# Patient Record
Sex: Male | Born: 1935 | Race: White | Hispanic: No | Marital: Married | Smoking: Never smoker
Health system: Southern US, Community
[De-identification: ages and names within clinical notes are randomized; demographics above are authoritative.]

## PROBLEM LIST (undated history)

## (undated) DIAGNOSIS — N400 Enlarged prostate without lower urinary tract symptoms: Secondary | ICD-10-CM

## (undated) DIAGNOSIS — I10 Essential (primary) hypertension: Secondary | ICD-10-CM

## (undated) DIAGNOSIS — K922 Gastrointestinal hemorrhage, unspecified: Secondary | ICD-10-CM

## (undated) HISTORY — PX: APPENDECTOMY: SHX54

## (undated) HISTORY — PX: CYST EXCISION: SHX5701

## (undated) HISTORY — PX: CHOLECYSTECTOMY: SHX55

---

## 2001-07-10 ENCOUNTER — Encounter: Payer: Self-pay | Admitting: Family Medicine

## 2001-07-10 ENCOUNTER — Ambulatory Visit (HOSPITAL_COMMUNITY): Admission: RE | Admit: 2001-07-10 | Discharge: 2001-07-10 | Payer: Self-pay | Admitting: Family Medicine

## 2002-02-09 ENCOUNTER — Other Ambulatory Visit: Admission: RE | Admit: 2002-02-09 | Discharge: 2002-02-09 | Payer: Self-pay | Admitting: Dermatology

## 2002-08-05 ENCOUNTER — Encounter: Payer: Self-pay | Admitting: Family Medicine

## 2002-08-05 ENCOUNTER — Ambulatory Visit (HOSPITAL_COMMUNITY): Admission: RE | Admit: 2002-08-05 | Discharge: 2002-08-05 | Payer: Self-pay | Admitting: Family Medicine

## 2002-11-07 ENCOUNTER — Encounter: Payer: Self-pay | Admitting: *Deleted

## 2002-11-07 ENCOUNTER — Emergency Department (HOSPITAL_COMMUNITY): Admission: EM | Admit: 2002-11-07 | Discharge: 2002-11-07 | Payer: Self-pay | Admitting: *Deleted

## 2003-03-03 ENCOUNTER — Ambulatory Visit (HOSPITAL_COMMUNITY): Admission: RE | Admit: 2003-03-03 | Discharge: 2003-03-03 | Payer: Self-pay | Admitting: Internal Medicine

## 2004-02-18 ENCOUNTER — Ambulatory Visit (HOSPITAL_COMMUNITY): Admission: RE | Admit: 2004-02-18 | Discharge: 2004-02-18 | Payer: Self-pay | Admitting: Family Medicine

## 2004-07-06 ENCOUNTER — Ambulatory Visit (HOSPITAL_COMMUNITY): Admission: RE | Admit: 2004-07-06 | Discharge: 2004-07-06 | Payer: Self-pay | Admitting: Family Medicine

## 2004-12-18 ENCOUNTER — Ambulatory Visit: Payer: Self-pay | Admitting: Internal Medicine

## 2005-04-10 ENCOUNTER — Ambulatory Visit (HOSPITAL_COMMUNITY): Admission: RE | Admit: 2005-04-10 | Discharge: 2005-04-10 | Payer: Self-pay | Admitting: Family Medicine

## 2005-08-03 ENCOUNTER — Other Ambulatory Visit: Admission: RE | Admit: 2005-08-03 | Discharge: 2005-08-03 | Payer: Self-pay | Admitting: Urology

## 2006-03-19 ENCOUNTER — Ambulatory Visit (HOSPITAL_COMMUNITY): Admission: RE | Admit: 2006-03-19 | Discharge: 2006-03-19 | Payer: Self-pay | Admitting: Family Medicine

## 2006-04-11 ENCOUNTER — Ambulatory Visit (HOSPITAL_COMMUNITY): Admission: RE | Admit: 2006-04-11 | Discharge: 2006-04-11 | Payer: Self-pay | Admitting: Family Medicine

## 2006-06-18 ENCOUNTER — Ambulatory Visit (HOSPITAL_COMMUNITY): Admission: RE | Admit: 2006-06-18 | Discharge: 2006-06-18 | Payer: Self-pay | Admitting: Family Medicine

## 2007-01-16 ENCOUNTER — Ambulatory Visit (HOSPITAL_COMMUNITY): Admission: RE | Admit: 2007-01-16 | Discharge: 2007-01-16 | Payer: Self-pay | Admitting: Internal Medicine

## 2007-05-05 ENCOUNTER — Ambulatory Visit (HOSPITAL_COMMUNITY): Admission: RE | Admit: 2007-05-05 | Discharge: 2007-05-05 | Payer: Self-pay | Admitting: Family Medicine

## 2007-12-16 ENCOUNTER — Ambulatory Visit (HOSPITAL_COMMUNITY): Admission: RE | Admit: 2007-12-16 | Discharge: 2007-12-16 | Payer: Self-pay | Admitting: *Deleted

## 2009-03-15 ENCOUNTER — Ambulatory Visit (HOSPITAL_COMMUNITY): Admission: RE | Admit: 2009-03-15 | Discharge: 2009-03-15 | Payer: Self-pay | Admitting: Family Medicine

## 2011-03-02 NOTE — Op Note (Signed)
NAME:  NICK, STULTS NO.:  [id number]   MEDICAL RECORD NO.:  [id number]                   PATIENT TYPE:   LOCATION:                                       FACILITY:   PHYSICIAN:  Lionel December, M.D.                 DATE OF BIRTH:   DATE OF PROCEDURE:  03/03/2003  DATE OF DISCHARGE:                                 OPERATIVE REPORT   PROCEDURE:  Total colonoscopy.   ENDOSCOPIST:  Lionel December, M.D.   INDICATIONS:  This patient is a 77 year old Caucasian male who is undergoing  surveillance colonoscopy.  His last exam was in January 1999.  He has a  small adenoma removed from this cecum.  He is presently asymptomatic.  The  procedure and risks were reviewed with the patient and informed consent was  obtained.   PREOPERATIVE MEDICATIONS:  Demerol 25 mg IV and Versed 3 mg IV.   INSTRUMENT:  Olympus video system.   FINDINGS:  Procedure performed in endoscopy suite.  The patient's vital  signs and O2 saturation were monitored during the procedure and remained  stable.  The patient was placed in the left lateral recumbent position and  rectal examination was performed.  This was within normal limits.   The scope was placed in the rectum and advanced under vision into the  sigmoid colon and beyond.  Preparation was satisfactory.  He has scattered  diverticula throughout the colon, but most of these were in the sigmoid  colon.  The scope was passed to the cecum which was identified by  appendiceal orifice and ileocecal valve.  Pictures were taken for the  record.  As the scope was withdrawn the colonic mucosa was, once again,  carefully examined.  There were 4 small polyps; one at proximal transverse  colon, 2 at sigmoid colon and the largest was in the rectum about 6 mm or  so.  All of these polyps were ablated by cold biopsy.  Polyps from the  sigmoid colon were submitted in 1 container.  The rectal mucosa was  otherwise normal.   The scope was  retroflexed to examine the anorectal junction which was  unremarkable.  The endoscope was straightened and withdrawn.  The patient  tolerated the procedure well.   FINAL DIAGNOSES:  1. Four small polyps that were ablated by cold biopsy.  One was at     transverse colon, two at sigmoid and one in the rectum.  2. Pancolonic diverticulosis.   RECOMMENDATIONS:  1. He will resume his usual meds.  2.     He will continue high fiber diet with Citrucel.  3. I will contact patient with the biopsy results.  If one or more of these     polyps are adenomas he will return for another exam in 5 years from now.  Lionel December, M.D.    NR/MEDQ  D:  03/03/2003  T:  03/03/2003  Job:  [id number]   cc:   Kirk Ruths, M.D.  [address]  [place]  [place] [address]  Fax: [phone]

## 2011-10-25 DIAGNOSIS — L219 Seborrheic dermatitis, unspecified: Secondary | ICD-10-CM | POA: Diagnosis not present

## 2011-10-25 DIAGNOSIS — C44211 Basal cell carcinoma of skin of unspecified ear and external auricular canal: Secondary | ICD-10-CM | POA: Diagnosis not present

## 2011-10-25 DIAGNOSIS — D235 Other benign neoplasm of skin of trunk: Secondary | ICD-10-CM | POA: Diagnosis not present

## 2011-10-25 DIAGNOSIS — L259 Unspecified contact dermatitis, unspecified cause: Secondary | ICD-10-CM | POA: Diagnosis not present

## 2011-12-13 DIAGNOSIS — Z85828 Personal history of other malignant neoplasm of skin: Secondary | ICD-10-CM | POA: Diagnosis not present

## 2011-12-13 DIAGNOSIS — L570 Actinic keratosis: Secondary | ICD-10-CM | POA: Diagnosis not present

## 2012-01-08 DIAGNOSIS — S335XXA Sprain of ligaments of lumbar spine, initial encounter: Secondary | ICD-10-CM | POA: Diagnosis not present

## 2012-01-08 DIAGNOSIS — R7301 Impaired fasting glucose: Secondary | ICD-10-CM | POA: Diagnosis not present

## 2012-01-08 DIAGNOSIS — M545 Low back pain: Secondary | ICD-10-CM | POA: Diagnosis not present

## 2012-01-08 DIAGNOSIS — Z6830 Body mass index (BMI) 30.0-30.9, adult: Secondary | ICD-10-CM | POA: Diagnosis not present

## 2012-02-11 DIAGNOSIS — E785 Hyperlipidemia, unspecified: Secondary | ICD-10-CM | POA: Diagnosis not present

## 2012-02-11 DIAGNOSIS — Z0000 Encounter for general adult medical examination without abnormal findings: Secondary | ICD-10-CM | POA: Diagnosis not present

## 2012-02-11 DIAGNOSIS — I10 Essential (primary) hypertension: Secondary | ICD-10-CM | POA: Diagnosis not present

## 2012-02-11 DIAGNOSIS — Z6830 Body mass index (BMI) 30.0-30.9, adult: Secondary | ICD-10-CM | POA: Diagnosis not present

## 2012-02-12 DIAGNOSIS — E785 Hyperlipidemia, unspecified: Secondary | ICD-10-CM | POA: Diagnosis not present

## 2012-02-12 DIAGNOSIS — I10 Essential (primary) hypertension: Secondary | ICD-10-CM | POA: Diagnosis not present

## 2012-03-18 DIAGNOSIS — R972 Elevated prostate specific antigen [PSA]: Secondary | ICD-10-CM | POA: Diagnosis not present

## 2012-03-24 DIAGNOSIS — R972 Elevated prostate specific antigen [PSA]: Secondary | ICD-10-CM | POA: Diagnosis not present

## 2012-04-14 DIAGNOSIS — H43399 Other vitreous opacities, unspecified eye: Secondary | ICD-10-CM | POA: Diagnosis not present

## 2012-06-24 DIAGNOSIS — Z23 Encounter for immunization: Secondary | ICD-10-CM | POA: Diagnosis not present

## 2012-09-18 DIAGNOSIS — R972 Elevated prostate specific antigen [PSA]: Secondary | ICD-10-CM | POA: Diagnosis not present

## 2012-09-24 DIAGNOSIS — R972 Elevated prostate specific antigen [PSA]: Secondary | ICD-10-CM | POA: Diagnosis not present

## 2012-10-16 DIAGNOSIS — H43399 Other vitreous opacities, unspecified eye: Secondary | ICD-10-CM | POA: Diagnosis not present

## 2013-02-09 ENCOUNTER — Encounter (INDEPENDENT_AMBULATORY_CARE_PROVIDER_SITE_OTHER): Payer: Self-pay | Admitting: *Deleted

## 2013-02-12 ENCOUNTER — Other Ambulatory Visit (INDEPENDENT_AMBULATORY_CARE_PROVIDER_SITE_OTHER): Payer: Self-pay | Admitting: *Deleted

## 2013-02-12 DIAGNOSIS — Z86010 Personal history of colonic polyps: Secondary | ICD-10-CM

## 2013-02-13 ENCOUNTER — Telehealth (INDEPENDENT_AMBULATORY_CARE_PROVIDER_SITE_OTHER): Payer: Self-pay | Admitting: *Deleted

## 2013-02-13 DIAGNOSIS — Z1211 Encounter for screening for malignant neoplasm of colon: Secondary | ICD-10-CM

## 2013-02-13 MED ORDER — PEG-KCL-NACL-NASULF-NA ASC-C 100 G PO SOLR: 1.0000 | Freq: Once | ORAL | Status: DC

## 2013-02-13 NOTE — Telephone Encounter (Signed)
Patient needs movi prep 

## 2013-02-25 ENCOUNTER — Telehealth (INDEPENDENT_AMBULATORY_CARE_PROVIDER_SITE_OTHER): Payer: Self-pay | Admitting: *Deleted

## 2013-02-25 NOTE — Telephone Encounter (Signed)
  Procedure: tcs  Reason/Indication:  Hx polyps  Has patient had this procedure before?  02/2008 (paper chart)  If so, when, by whom and where?    Is there a family history of colon cancer?  no  Who?  What age when diagnosed?    Is patient diabetic?   no      Does patient have prosthetic heart valve?  no  Do you have a pacemaker?  no  Has patient ever had endocarditis? no  Has patient had joint replacement within last 12 months?  no  Is patient on Coumadin, Plavix and/or Aspirin? yes  Medications: asa 81 mg daily, nifedipine 30 mg daily, omeprazole 40 mg bid, vitamins  Allergies: nkda  Medication Adjustment: asa 2 days  Procedure date & time: 03/26/13 at 830

## 2013-02-26 NOTE — Telephone Encounter (Signed)
agree

## 2013-03-16 ENCOUNTER — Encounter (HOSPITAL_COMMUNITY): Payer: Self-pay | Admitting: Pharmacy Technician

## 2013-03-20 DIAGNOSIS — R7301 Impaired fasting glucose: Secondary | ICD-10-CM | POA: Diagnosis not present

## 2013-03-20 DIAGNOSIS — I10 Essential (primary) hypertension: Secondary | ICD-10-CM | POA: Diagnosis not present

## 2013-03-20 DIAGNOSIS — E785 Hyperlipidemia, unspecified: Secondary | ICD-10-CM | POA: Diagnosis not present

## 2013-03-20 DIAGNOSIS — Z0000 Encounter for general adult medical examination without abnormal findings: Secondary | ICD-10-CM | POA: Diagnosis not present

## 2013-03-20 DIAGNOSIS — Z6829 Body mass index (BMI) 29.0-29.9, adult: Secondary | ICD-10-CM | POA: Diagnosis not present

## 2013-03-20 DIAGNOSIS — Z125 Encounter for screening for malignant neoplasm of prostate: Secondary | ICD-10-CM | POA: Diagnosis not present

## 2013-03-25 DIAGNOSIS — R972 Elevated prostate specific antigen [PSA]: Secondary | ICD-10-CM | POA: Diagnosis not present

## 2013-03-26 ENCOUNTER — Encounter (HOSPITAL_COMMUNITY): Payer: Self-pay | Admitting: *Deleted

## 2013-03-26 ENCOUNTER — Encounter (HOSPITAL_COMMUNITY)
Admission: RE | Disposition: A | Discharge status: Home / Self Care | Payer: Self-pay | Source: Ambulatory Visit | Attending: Internal Medicine

## 2013-03-26 ENCOUNTER — Ambulatory Visit (HOSPITAL_COMMUNITY)
Admission: RE | Admit: 2013-03-26 | Discharge: 2013-03-26 | Disposition: A | Discharge status: Home / Self Care | Payer: Medicare Other | Source: Ambulatory Visit | Attending: Internal Medicine | Admitting: Internal Medicine

## 2013-03-26 DIAGNOSIS — Z860100 Personal history of colon polyps, unspecified: Secondary | ICD-10-CM | POA: Insufficient documentation

## 2013-03-26 DIAGNOSIS — Z09 Encounter for follow-up examination after completed treatment for conditions other than malignant neoplasm: Secondary | ICD-10-CM | POA: Insufficient documentation

## 2013-03-26 DIAGNOSIS — K644 Residual hemorrhoidal skin tags: Secondary | ICD-10-CM

## 2013-03-26 DIAGNOSIS — K5730 Diverticulosis of large intestine without perforation or abscess without bleeding: Secondary | ICD-10-CM | POA: Insufficient documentation

## 2013-03-26 HISTORY — PX: COLONOSCOPY: SHX5424

## 2013-03-26 SURGERY — COLONOSCOPY
Anesthesia: Moderate Sedation

## 2013-03-26 MED ORDER — STERILE WATER FOR IRRIGATION IR SOLN
Status: DC | PRN
  Administered 2013-03-26: 09:00:00

## 2013-03-26 MED ORDER — MEPERIDINE HCL 50 MG/ML IJ SOLN
INTRAMUSCULAR | Status: DC | PRN
  Administered 2013-03-26: 25 mg via INTRAVENOUS

## 2013-03-26 MED ORDER — SODIUM CHLORIDE 0.9 % IV SOLN
INTRAVENOUS | Status: DC
  Administered 2013-03-26: 08:00:00 via INTRAVENOUS

## 2013-03-26 MED ORDER — MIDAZOLAM HCL 5 MG/5ML IJ SOLN
INTRAMUSCULAR | Status: DC | PRN
  Administered 2013-03-26: 1 mg via INTRAVENOUS
  Administered 2013-03-26 (×2): 2 mg via INTRAVENOUS

## 2013-03-26 MED ORDER — MEPERIDINE HCL 50 MG/ML IJ SOLN
INTRAMUSCULAR | Status: AC
  Filled 2013-03-26: qty 1

## 2013-03-26 MED ORDER — MIDAZOLAM HCL 5 MG/5ML IJ SOLN
INTRAMUSCULAR | Status: AC
  Filled 2013-03-26: qty 10

## 2013-03-26 NOTE — Op Note (Signed)
COLONOSCOPY PROCEDURE REPORT  PATIENT:  Wesley Reynolds  MR#:  [id number] Birthdate:  04/22/1931, 79 y.o., male Endoscopist:  Dr. Malissa Hippo, MD Referred By:  Dr. Colette Ribas, MD  Procedure Date: 03/26/2013  Procedure:   Colonoscopy  Indications: Patient is 79 year old Caucasian male with history of colonic adenomas. His last exam was 5 years ago.  Informed Consent:  The procedure and risks were reviewed with the patient and informed consent was obtained.  Medications:  Demerol 25 mg IV Versed  5 mg IV  Description of procedure:  After a digital rectal exam was performed, that colonoscope was advanced from the anus through the rectum and colon to the area of the cecum, ileocecal valve and appendiceal orifice. The cecum was deeply intubated. These structures were well-seen and photographed for the record. From the level of the cecum and ileocecal valve, the scope was slowly and cautiously withdrawn. The mucosal surfaces were carefully surveyed utilizing scope tip to flexion to facilitate fold flattening as needed. The scope was pulled down into the rectum where a thorough exam including retroflexion was performed.  Findings:   Prep excellent. Scattered diverticula throughout the colon but most of these were at sigmoid colon. No evidence of colonic polyps. Normal rectal mucosa. Small hemorrhoids below the dentate line. .   Therapeutic/Diagnostic Maneuvers Performed:  None  Complications:  None  Cecal Withdrawal Time:  10 minutes  Impression:  Examination performed to cecum. Pan colonic diverticulosis. No evidence of recurrent polyps.   Recommendations:  Standard instructions given. Office visit in 5 years.   REHMAN,NAJEEB U  03/26/2013 9:14 AM  CC: Dr. Phillips Odor, Chancy Hurter, MD & Dr. Bonnetta Barry ref. provider found

## 2013-03-26 NOTE — H&P (Signed)
Wesley Reynolds is an 79 y.o. male.   Chief Complaint: Patient is for colonoscopy. HPI: Patient is 79 year old Caucasian male who has history of colonic adenomas and is for surveillance colonoscopy. His last exam was 5 years ago at [facility] in [place], [place] with removal of 4 small polyps. He denies abdominal pain change in his bowel habits or rectal bleeding. Family history is negative for colorectal carcinoma.  History reviewed. No pertinent past medical history.  History reviewed. No pertinent past surgical history.  History reviewed. No pertinent family history. Social History:  has no tobacco, alcohol, and drug history on file.  Allergies: No Known Allergies  Medications Prior to Admission  Medication Sig Dispense Refill  . aspirin EC 81 MG tablet Take 81 mg by mouth daily.      . Multiple Vitamins-Minerals (MULTIVITAMINS THER. W/MINERALS) TABS Take 1 tablet by mouth daily.      Marland Kitchen NIFEdipine (PROCARDIA-XL/ADALAT-CC/NIFEDICAL-XL) 30 MG 24 hr tablet Take 30 mg by mouth daily.      Marland Kitchen omeprazole (PRILOSEC) 40 MG capsule Take 40 mg by mouth 2 (two) times daily.        No results found for this or any previous visit (from the past 48 hour(s)). No results found.  ROS  Blood pressure 158/89, pulse 103, temperature 98.1 F (36.7 C), temperature source Oral, SpO2 96.00%. Physical Exam  Constitutional: He appears well-developed and well-nourished.  HENT:  Mouth/Throat: Oropharynx is clear and moist.  Eyes: Conjunctivae are normal. No scleral icterus.  Neck: No thyromegaly present.  Cardiovascular: Normal rate, regular rhythm and normal heart sounds.   No murmur heard. Respiratory: Effort normal and breath sounds normal.  GI: Soft. He exhibits no distension and no mass. There is no tenderness.  Musculoskeletal: He exhibits no edema.  Lymphadenopathy:    He has no cervical adenopathy.  Neurological: He is alert.  Skin: Skin is warm and dry.     Assessment/Plan History of colonic  adenomas. Surveillance colonoscopy.  REHMAN,NAJEEB U 03/26/2013, 8:45 AM

## 2013-03-27 ENCOUNTER — Encounter (HOSPITAL_COMMUNITY): Payer: Self-pay | Admitting: Internal Medicine

## 2013-04-16 DIAGNOSIS — H524 Presbyopia: Secondary | ICD-10-CM | POA: Diagnosis not present

## 2013-04-16 DIAGNOSIS — H5200 Hypermetropia, unspecified eye: Secondary | ICD-10-CM | POA: Diagnosis not present

## 2013-04-16 DIAGNOSIS — H25019 Cortical age-related cataract, unspecified eye: Secondary | ICD-10-CM | POA: Diagnosis not present

## 2013-04-16 DIAGNOSIS — H52229 Regular astigmatism, unspecified eye: Secondary | ICD-10-CM | POA: Diagnosis not present

## 2013-06-12 DIAGNOSIS — J984 Other disorders of lung: Secondary | ICD-10-CM | POA: Diagnosis not present

## 2013-06-12 DIAGNOSIS — Z6829 Body mass index (BMI) 29.0-29.9, adult: Secondary | ICD-10-CM | POA: Diagnosis not present

## 2013-07-09 DIAGNOSIS — Z23 Encounter for immunization: Secondary | ICD-10-CM | POA: Diagnosis not present

## 2013-08-03 DIAGNOSIS — R972 Elevated prostate specific antigen [PSA]: Secondary | ICD-10-CM | POA: Diagnosis not present

## 2013-08-05 DIAGNOSIS — R972 Elevated prostate specific antigen [PSA]: Secondary | ICD-10-CM | POA: Diagnosis not present

## 2013-09-23 DIAGNOSIS — J069 Acute upper respiratory infection, unspecified: Secondary | ICD-10-CM | POA: Diagnosis not present

## 2013-09-23 DIAGNOSIS — B9789 Other viral agents as the cause of diseases classified elsewhere: Secondary | ICD-10-CM | POA: Diagnosis not present

## 2013-09-23 DIAGNOSIS — Z6829 Body mass index (BMI) 29.0-29.9, adult: Secondary | ICD-10-CM | POA: Diagnosis not present

## 2014-04-05 DIAGNOSIS — R972 Elevated prostate specific antigen [PSA]: Secondary | ICD-10-CM | POA: Diagnosis not present

## 2014-04-14 DIAGNOSIS — R972 Elevated prostate specific antigen [PSA]: Secondary | ICD-10-CM | POA: Diagnosis not present

## 2014-04-19 DIAGNOSIS — H524 Presbyopia: Secondary | ICD-10-CM | POA: Diagnosis not present

## 2014-04-19 DIAGNOSIS — H2589 Other age-related cataract: Secondary | ICD-10-CM | POA: Diagnosis not present

## 2014-04-19 DIAGNOSIS — H5200 Hypermetropia, unspecified eye: Secondary | ICD-10-CM | POA: Diagnosis not present

## 2014-04-19 DIAGNOSIS — H52229 Regular astigmatism, unspecified eye: Secondary | ICD-10-CM | POA: Diagnosis not present

## 2014-06-02 DIAGNOSIS — R7301 Impaired fasting glucose: Secondary | ICD-10-CM | POA: Diagnosis not present

## 2014-06-02 DIAGNOSIS — I10 Essential (primary) hypertension: Secondary | ICD-10-CM | POA: Diagnosis not present

## 2014-06-02 DIAGNOSIS — Z6825 Body mass index (BMI) 25.0-25.9, adult: Secondary | ICD-10-CM | POA: Diagnosis not present

## 2014-06-02 DIAGNOSIS — Z23 Encounter for immunization: Secondary | ICD-10-CM | POA: Diagnosis not present

## 2014-06-02 DIAGNOSIS — Z0000 Encounter for general adult medical examination without abnormal findings: Secondary | ICD-10-CM | POA: Diagnosis not present

## 2014-07-13 ENCOUNTER — Emergency Department (HOSPITAL_COMMUNITY)
Admission: EM | Admit: 2014-07-13 | Discharge: 2014-07-13 | Disposition: A | Discharge status: Home / Self Care | Payer: Medicare Other | Attending: Emergency Medicine | Admitting: Emergency Medicine

## 2014-07-13 ENCOUNTER — Encounter (HOSPITAL_COMMUNITY): Payer: Self-pay | Admitting: Emergency Medicine

## 2014-07-13 DIAGNOSIS — I10 Essential (primary) hypertension: Secondary | ICD-10-CM | POA: Diagnosis not present

## 2014-07-13 DIAGNOSIS — Z79899 Other long term (current) drug therapy: Secondary | ICD-10-CM | POA: Insufficient documentation

## 2014-07-13 DIAGNOSIS — Z87448 Personal history of other diseases of urinary system: Secondary | ICD-10-CM | POA: Insufficient documentation

## 2014-07-13 DIAGNOSIS — Z7982 Long term (current) use of aspirin: Secondary | ICD-10-CM | POA: Diagnosis not present

## 2014-07-13 HISTORY — DX: Essential (primary) hypertension: I10

## 2014-07-13 HISTORY — DX: Benign prostatic hyperplasia without lower urinary tract symptoms: N40.0

## 2014-07-13 NOTE — ED Notes (Signed)
Flush and warm yesterday after working out at gym.  C/o feeling light headed but not chest pain or SOB.

## 2014-07-13 NOTE — Discharge Instructions (Signed)

## 2014-07-13 NOTE — ED Notes (Signed)
Onset yesterday, pt notice BP going up and down, flushed face

## 2014-07-13 NOTE — ED Provider Notes (Signed)
CSN: [id number]     Arrival date & time 07/13/14  1128 History  This chart was scribed for Sharyon Cable, MD by Delphia Grates, ED Scribe. This patient was seen in room APA04/[facility]04 and the patient's care was started at 12:04 PM.    Chief Complaint  Patient presents with  . Hypertension     Patient is a 80 y.o. male presenting with hypertension. The history is provided by the patient. No language interpreter was used.  Hypertension This is a chronic problem. The current episode started yesterday. The problem has not changed since onset.Associated symptoms include shortness of breath. Pertinent negatives include no chest pain, no abdominal pain and no headaches.    HPI Comments: Wesley Reynolds is a 80 y.o. male who presents to the Emergency Department complaining of HTN onset yesterday. Patient states he monitors his BP at home and noticed that it fluctuates. He states he feels "warm and flushed in the face". He reports associated SOB when his BP is elevated but this is now resolved. He reports history of similar episodes that occur after he walks where his BP will elevate.  Patient states he is unsure of the cause and reports he is compliant with all his medications. He denies CP, confusion, HA, dizziness, confusion, vision changes, numbness/weakness, or any new medications. Patient denies history of MI or stroke.  PCP: Dr. Hilma Favors  Past Medical History  Diagnosis Date  . Hypertension   . BPH (benign prostatic hyperplasia)    Past Surgical History  Procedure Laterality Date  . Colonoscopy N/A 03/26/2013    Procedure: COLONOSCOPY;  Surgeon: Rogene Houston, MD;  Location: [facility];  Service: Endoscopy;  Laterality: N/A;  830   No family history on file. History  Substance Use Topics  . Smoking status: Never Smoker   . Smokeless tobacco: Not on file  . Alcohol Use: No    Review of Systems  Respiratory: Positive for shortness of breath.   Cardiovascular: Negative for  chest pain.  Gastrointestinal: Negative for abdominal pain.  Neurological: Negative for headaches.  All other systems reviewed and are negative.     Allergies  Review of patient's allergies indicates no known allergies.  Home Medications   Prior to Admission medications   Medication Sig Start Date End Date Taking? Authorizing Provider  aspirin EC 81 MG tablet Take 81 mg by mouth daily.    Historical Provider, MD  Multiple Vitamins-Minerals (MULTIVITAMINS THER. W/MINERALS) TABS Take 1 tablet by mouth daily.    Historical Provider, MD  NIFEdipine (PROCARDIA-XL/ADALAT-CC/NIFEDICAL-XL) 30 MG 24 hr tablet Take 30 mg by mouth daily.    Historical Provider, MD  omeprazole (PRILOSEC) 40 MG capsule Take 40 mg by mouth 2 (two) times daily.    Historical Provider, MD   Triage Vitals: BP 146/98  Pulse 84  Temp(Src) 98.1 F (36.7 C) (Oral)  Resp 18  Ht 5\' 9"  (1.753 m)  Wt 170 lb (77.111 kg)  BMI 25.09 kg/m2  SpO2 98%  Physical Exam CONSTITUTIONAL: Well developed/well nourished, pt is well appearing, smiling and in no distress HEAD: Normocephalic/atraumatic EYES: EOMI/PERRL, no nystagmus ENMT: Mucous membranes moist NECK: supple no meningeal signs, no bruits CV: S1/S2 noted, no murmurs/rubs/gallops noted LUNGS: Lungs are clear to auscultation bilaterally, no apparent distress ABDOMEN: soft, nontender, no rebound or guarding GU:no cva tenderness NEURO:Awake/alert, facies symmetric, no arm or leg drift is noted Equal 5/5 strength with shoulder abduction, elbow flex/extension, wrist flex/extension in upper extremities and equal hand  grips bilaterally Equal 5/5 strength with hip flexion,knee flex/extension, foot dorsi/plantar flexion Cranial nerves 3/4/5/6/04/22/09/11/12 tested and intact Gait normal without ataxia No past pointing EXTREMITIES: pulses normal, full ROM SKIN: warm, color normal PSYCH: no abnormalities of mood noted  ED Course  Procedures   DIAGNOSTIC STUDIES: Oxygen  Saturation is 98% on room air, normal by my interpretation.    COORDINATION OF CARE: At 1212 Discussed treatment plan with patient which includes labs. Patient agrees.   12:29 PM- Informed patient that his labs were within normal limits. Pt advised of plan for treatment and pt agrees. Will not add on any new meds at this time He is well appearing and no signs of acute HTN emergency Appropriate for d/c home     Labs Review Labs Reviewed  I-STAT CHEM 8, ED      MDM   Final diagnoses:  Essential hypertension    Nursing notes including past medical history and social history reviewed and considered in documentation Labs/vital reviewed and considered   I personally performed the services described in this documentation, which was scribed in my presence. The recorded information has been reviewed and is accurate.      Sharyon Cable, MD 07/13/14 1322

## 2014-07-13 NOTE — ED Notes (Addendum)
C/o feeling hot and flush after [place].

## 2014-07-14 LAB — I-STAT CHEM 8, ED
BUN: 13 mg/dL (ref 6–23)
Calcium, Ion: 1.19 mmol/L (ref 1.13–1.30)
Chloride: 103 mEq/L (ref 96–112)
Creatinine, Ser: 0.9 mg/dL (ref 0.50–1.35)
GLUCOSE: 144 mg/dL — AB (ref 70–99)
HEMATOCRIT: 48 % (ref 39.0–52.0)
Hemoglobin: 16.3 g/dL (ref 13.0–17.0)
Potassium: 4.5 mEq/L (ref 3.7–5.3)
SODIUM: 138 meq/L (ref 137–147)
TCO2: 27 mmol/L (ref 0–100)

## 2014-07-19 DIAGNOSIS — Z6826 Body mass index (BMI) 26.0-26.9, adult: Secondary | ICD-10-CM | POA: Diagnosis not present

## 2014-07-19 DIAGNOSIS — Z23 Encounter for immunization: Secondary | ICD-10-CM | POA: Diagnosis not present

## 2014-07-19 DIAGNOSIS — E782 Mixed hyperlipidemia: Secondary | ICD-10-CM | POA: Diagnosis not present

## 2014-07-19 DIAGNOSIS — I10 Essential (primary) hypertension: Secondary | ICD-10-CM | POA: Diagnosis not present

## 2014-09-13 DIAGNOSIS — R972 Elevated prostate specific antigen [PSA]: Secondary | ICD-10-CM | POA: Diagnosis not present

## 2014-09-15 DIAGNOSIS — R972 Elevated prostate specific antigen [PSA]: Secondary | ICD-10-CM | POA: Diagnosis not present

## 2014-09-17 ENCOUNTER — Encounter (HOSPITAL_COMMUNITY): Payer: Self-pay

## 2014-09-17 ENCOUNTER — Other Ambulatory Visit: Payer: Self-pay

## 2014-09-17 ENCOUNTER — Emergency Department (HOSPITAL_COMMUNITY)
Admission: EM | Admit: 2014-09-17 | Discharge: 2014-09-17 | Disposition: A | Discharge status: Home / Self Care | Payer: Medicare Other | Attending: Emergency Medicine | Admitting: Emergency Medicine

## 2014-09-17 DIAGNOSIS — Z7982 Long term (current) use of aspirin: Secondary | ICD-10-CM | POA: Insufficient documentation

## 2014-09-17 DIAGNOSIS — G479 Sleep disorder, unspecified: Secondary | ICD-10-CM | POA: Diagnosis not present

## 2014-09-17 DIAGNOSIS — Z79899 Other long term (current) drug therapy: Secondary | ICD-10-CM | POA: Insufficient documentation

## 2014-09-17 DIAGNOSIS — R42 Dizziness and giddiness: Secondary | ICD-10-CM | POA: Diagnosis not present

## 2014-09-17 DIAGNOSIS — R5383 Other fatigue: Secondary | ICD-10-CM | POA: Insufficient documentation

## 2014-09-17 DIAGNOSIS — I10 Essential (primary) hypertension: Secondary | ICD-10-CM

## 2014-09-17 DIAGNOSIS — Z87438 Personal history of other diseases of male genital organs: Secondary | ICD-10-CM | POA: Insufficient documentation

## 2014-09-17 LAB — URINALYSIS, ROUTINE W REFLEX MICROSCOPIC
Bilirubin Urine: NEGATIVE
Glucose, UA: 250 mg/dL — AB
Ketones, ur: NEGATIVE mg/dL
LEUKOCYTES UA: NEGATIVE
Nitrite: NEGATIVE
PH: 6.5 (ref 5.0–8.0)
Protein, ur: NEGATIVE mg/dL
SPECIFIC GRAVITY, URINE: 1.01 (ref 1.005–1.030)
Urobilinogen, UA: 0.2 mg/dL (ref 0.0–1.0)

## 2014-09-17 LAB — I-STAT CHEM 8, ED
BUN: 13 mg/dL (ref 6–23)
CALCIUM ION: 1.15 mmol/L (ref 1.13–1.30)
Chloride: 101 mEq/L (ref 96–112)
Creatinine, Ser: 0.7 mg/dL (ref 0.50–1.35)
Glucose, Bld: 168 mg/dL — ABNORMAL HIGH (ref 70–99)
HEMATOCRIT: 53 % — AB (ref 39.0–52.0)
Hemoglobin: 18 g/dL — ABNORMAL HIGH (ref 13.0–17.0)
POTASSIUM: 3.7 meq/L (ref 3.7–5.3)
Sodium: 139 mEq/L (ref 137–147)
TCO2: 25 mmol/L (ref 0–100)

## 2014-09-17 LAB — URINE MICROSCOPIC-ADD ON

## 2014-09-17 NOTE — ED Notes (Signed)
Pt states "I think I have an inner ear infection"  States he has been dizzy and felt weak at times.  Pt states he has not been able to sleep all night.

## 2014-09-17 NOTE — ED Provider Notes (Signed)
CSN: [id number]     Arrival date & time 09/17/14  0515 History   First MD Initiated Contact with Patient 09/17/14 [phone]     Chief Complaint  Patient presents with  . Dizziness      Patient is a 80 y.o. male presenting with dizziness. The history is provided by the patient.  Dizziness Quality:  Unable to specify Severity:  Mild Onset quality:  Sudden Duration: several seconds. Timing:  Constant Progression:  Resolved Chronicity:  New Relieved by:  None tried Worsened by:  Nothing tried Associated symptoms: no chest pain, no headaches, no nausea, no shortness of breath, no syncope, no vision changes and no vomiting   this patient presents for multiple complaints:  1. He reports recent anxiety due to family stressors (sister in law is in nursing home) and also reports his prostate "numbers were high"  2. He also reports last night he was unable to sleep due to urinary frequency.  Everytime he would drift off to sleep he would have to get up to use the restroom.  He reports he takes flomax   3. He reports recent increase in his blood pressure even though he is taking his BP meds.  He reports since his last ED visit for HTN his physician increased his meds but still having episodes of HTN.  4. He reports while coming to the ED had brief episode (5 seconds) of dizziness but he is unable to fully elaborate how he felt dizzy.  He has no dizziness at this time.  He thought it could be an "inner ear infection"  He denies HA/CP/SOB.  No focal weakness reported  Past Medical History  Diagnosis Date  . Hypertension   . BPH (benign prostatic hyperplasia)    Past Surgical History  Procedure Laterality Date  . Colonoscopy N/A 03/26/2013    Procedure: COLONOSCOPY;  Surgeon: Rogene Houston, MD;  Location: [facility];  Service: Endoscopy;  Laterality: N/A;  830   No family history on file. History  Substance Use Topics  . Smoking status: Never Smoker   . Smokeless tobacco: Not on file   . Alcohol Use: No    Review of Systems  Constitutional: Positive for fatigue. Negative for fever.  Eyes: Negative for visual disturbance.  Respiratory: Negative for shortness of breath.   Cardiovascular: Negative for chest pain and syncope.  Gastrointestinal: Negative for nausea and vomiting.  Genitourinary: Negative for dysuria.  Neurological: Positive for dizziness. Negative for syncope and headaches.  Psychiatric/Behavioral: Positive for sleep disturbance. The patient is nervous/anxious.   All other systems reviewed and are negative.     Allergies  Review of patient's allergies indicates no known allergies.  Home Medications   Prior to Admission medications   Medication Sig Start Date End Date Taking? Authorizing Provider  aspirin EC 81 MG tablet Take 81 mg by mouth daily.   Yes Historical Provider, MD  Multiple Vitamins-Minerals (MULTIVITAMINS THER. W/MINERALS) TABS Take 1 tablet by mouth daily.   Yes Historical Provider, MD  NIFEdipine (PROCARDIA-XL/ADALAT-CC/NIFEDICAL-XL) 30 MG 24 hr tablet Take 30 mg by mouth daily.   Yes Historical Provider, MD  omeprazole (PRILOSEC) 40 MG capsule Take 40 mg by mouth 2 (two) times daily.   Yes Historical Provider, MD   BP 158/92 mmHg  Pulse 89  Temp(Src) 97.5 F (36.4 C) (Oral)  Resp 20  Ht 5\' 9"  (1.753 m)  Wt 174 lb (78.926 kg)  BMI 25.68 kg/m2  SpO2 100% Physical Exam CONSTITUTIONAL: Well  developed/well nourished HEAD: Normocephalic/atraumatic EYES: EOMI/PERRL, no nystagmus,  no ptosis ENMT: Mucous membranes moist. Small amt of fluid behind left TM but no erythema noted.  Right TM clear/intact NECK: supple no meningeal signs CV: S1/S2 noted, no murmurs/rubs/gallops noted LUNGS: Lungs are clear to auscultation bilaterally, no apparent distress ABDOMEN: soft, nontender, no rebound or guarding GU:no cva tenderness NEURO:Awake/alert, facies symmetric, no arm or leg drift is noted Equal 5/5 strength with shoulder abduction,  elbow flex/extension, wrist flex/extension in upper extremities  Equal 5/5 strength with hip flexion,knee flex/extension, foot dorsi/plantar flexion Cranial nerves 3/4/5/6/04/22/09/11/12 tested and intact Gait normal without ataxia No past pointing Sensation to light touch intact in all extremities EXTREMITIES: pulses normal, full ROM SKIN: warm, color normal PSYCH: no abnormalities of mood noted. Smiling, interactive and appropriate  ED Course  Procedures  Labs Review Labs Reviewed  URINALYSIS, ROUTINE W REFLEX MICROSCOPIC - Abnormal; Notable for the following:    Glucose, UA 250 (*)    Hgb urine dipstick TRACE (*)    All other components within normal limits  URINE MICROSCOPIC-ADD ON  I-STAT CHEM 8, ED  chem 8 - did not cross over into system - electrolytes within normal limits   Date: 09/17/2014 0610am  Rate: 100  Rhythm: sinus tachycardia  QRS Axis: left  Intervals: normal  ST/T Wave abnormalities: normal  Conduction Disutrbances:none  Narrative Interpretation:   Old EKG Reviewed: unchanged   MDM  6:45 AM Pt well appearing, no distress, no focal neuro deficits.  I have low suspicion for acute CVA (he mentioned brief/transient dizziness while en route to ER)  His main concern was lack of sleep and also his persistent HTN.  I advised need to check BP daily, keep log of readings and speak to his PCP about potentially increasing his meds He has no signs of acute hypertensive emergency (patient sleeping when I checked in on him at discharge)  Final diagnoses:  Essential hypertension  Dizziness    Nursing notes including past medical history and social history reviewed and considered in documentation Previous records reviewed and considered Labs/vital reviewed myself and considered during evaluation     Sharyon Cable, MD 09/17/14 [phone]

## 2014-09-17 NOTE — Discharge Instructions (Signed)

## 2014-09-23 DIAGNOSIS — Z6824 Body mass index (BMI) 24.0-24.9, adult: Secondary | ICD-10-CM | POA: Diagnosis not present

## 2014-09-23 DIAGNOSIS — I10 Essential (primary) hypertension: Secondary | ICD-10-CM | POA: Diagnosis not present

## 2014-10-12 DIAGNOSIS — G4700 Insomnia, unspecified: Secondary | ICD-10-CM | POA: Diagnosis not present

## 2014-10-12 DIAGNOSIS — Z6824 Body mass index (BMI) 24.0-24.9, adult: Secondary | ICD-10-CM | POA: Diagnosis not present

## 2014-10-12 DIAGNOSIS — Z79899 Other long term (current) drug therapy: Secondary | ICD-10-CM | POA: Diagnosis not present

## 2014-10-12 DIAGNOSIS — K922 Gastrointestinal hemorrhage, unspecified: Secondary | ICD-10-CM | POA: Diagnosis not present

## 2014-10-12 DIAGNOSIS — R972 Elevated prostate specific antigen [PSA]: Secondary | ICD-10-CM | POA: Diagnosis not present

## 2014-10-12 DIAGNOSIS — T39395S Adverse effect of other nonsteroidal anti-inflammatory drugs [NSAID], sequela: Secondary | ICD-10-CM | POA: Diagnosis not present

## 2014-11-01 DIAGNOSIS — M79606 Pain in leg, unspecified: Secondary | ICD-10-CM | POA: Diagnosis not present

## 2014-11-01 DIAGNOSIS — Z6824 Body mass index (BMI) 24.0-24.9, adult: Secondary | ICD-10-CM | POA: Diagnosis not present

## 2014-11-01 DIAGNOSIS — F5102 Adjustment insomnia: Secondary | ICD-10-CM | POA: Diagnosis not present

## 2014-11-15 DIAGNOSIS — K922 Gastrointestinal hemorrhage, unspecified: Secondary | ICD-10-CM

## 2014-11-15 HISTORY — DX: Gastrointestinal hemorrhage, unspecified: K92.2

## 2014-11-17 ENCOUNTER — Observation Stay (HOSPITAL_COMMUNITY): Payer: Medicare Other

## 2014-11-17 ENCOUNTER — Encounter (HOSPITAL_COMMUNITY): Payer: Self-pay | Admitting: Physical Medicine and Rehabilitation

## 2014-11-17 ENCOUNTER — Observation Stay (HOSPITAL_COMMUNITY)
Admission: EM | Admit: 2014-11-17 | Discharge: 2014-11-19 | Disposition: A | Discharge status: Home / Self Care | Payer: Medicare Other | Attending: Internal Medicine | Admitting: Internal Medicine

## 2014-11-17 DIAGNOSIS — Z86010 Personal history of colonic polyps: Secondary | ICD-10-CM | POA: Insufficient documentation

## 2014-11-17 DIAGNOSIS — Z79899 Other long term (current) drug therapy: Secondary | ICD-10-CM | POA: Insufficient documentation

## 2014-11-17 DIAGNOSIS — Z7982 Long term (current) use of aspirin: Secondary | ICD-10-CM | POA: Diagnosis not present

## 2014-11-17 DIAGNOSIS — R1310 Dysphagia, unspecified: Principal | ICD-10-CM | POA: Insufficient documentation

## 2014-11-17 DIAGNOSIS — R1319 Other dysphagia: Secondary | ICD-10-CM

## 2014-11-17 DIAGNOSIS — E43 Unspecified severe protein-calorie malnutrition: Secondary | ICD-10-CM | POA: Insufficient documentation

## 2014-11-17 DIAGNOSIS — K625 Hemorrhage of anus and rectum: Secondary | ICD-10-CM | POA: Diagnosis present

## 2014-11-17 DIAGNOSIS — R05 Cough: Secondary | ICD-10-CM | POA: Diagnosis not present

## 2014-11-17 DIAGNOSIS — G4700 Insomnia, unspecified: Secondary | ICD-10-CM | POA: Diagnosis present

## 2014-11-17 DIAGNOSIS — I10 Essential (primary) hypertension: Secondary | ICD-10-CM | POA: Diagnosis not present

## 2014-11-17 DIAGNOSIS — N400 Enlarged prostate without lower urinary tract symptoms: Secondary | ICD-10-CM | POA: Diagnosis present

## 2014-11-17 HISTORY — DX: Gastrointestinal hemorrhage, unspecified: K92.2

## 2014-11-17 LAB — CBC
HCT: 37.2 % — ABNORMAL LOW (ref 39.0–52.0)
HCT: 37.3 % — ABNORMAL LOW (ref 39.0–52.0)
Hemoglobin: 13.4 g/dL (ref 13.0–17.0)
Hemoglobin: 13.4 g/dL (ref 13.0–17.0)
MCH: 30.9 pg (ref 26.0–34.0)
MCH: 30.9 pg (ref 26.0–34.0)
MCHC: 36 g/dL (ref 30.0–36.0)
MCHC: 35.9 g/dL (ref 30.0–36.0)
MCV: 85.9 fL (ref 78.0–100.0)
MCV: 86.1 fL (ref 78.0–100.0)
PLATELETS: 219 10*3/uL (ref 150–400)
PLATELETS: 206 10*3/uL (ref 150–400)
RBC: 4.33 MIL/uL (ref 4.22–5.81)
RBC: 4.33 MIL/uL (ref 4.22–5.81)
RDW: 13 % (ref 11.5–15.5)
RDW: 13 % (ref 11.5–15.5)
WBC: 6.3 10*3/uL (ref 4.0–10.5)
WBC: 6.5 10*3/uL (ref 4.0–10.5)

## 2014-11-17 LAB — COMPREHENSIVE METABOLIC PANEL
ALT: 26 U/L (ref 0–53)
AST: 28 U/L (ref 0–37)
Albumin: 4.6 g/dL (ref 3.5–5.2)
Alkaline Phosphatase: 89 U/L (ref 39–117)
Anion gap: 10 (ref 5–15)
BUN: 19 mg/dL (ref 6–23)
CO2: 27 mmol/L (ref 19–32)
Calcium: 9.7 mg/dL (ref 8.4–10.5)
Chloride: 101 mmol/L (ref 96–112)
Creatinine, Ser: 0.88 mg/dL (ref 0.50–1.35)
GFR calc Af Amer: >90 mL/min (ref >90)
GFR calc non Af Amer: 78 mL/min — ABNORMAL LOW (ref >90)
Glucose, Bld: 148 mg/dL — ABNORMAL HIGH (ref 70–99)
Potassium: 3.2 mmol/L — ABNORMAL LOW (ref 3.5–5.1)
Sodium: 138 mmol/L (ref 135–145)
Total Bilirubin: 0.7 mg/dL (ref 0.3–1.2)
Total Protein: 7.7 g/dL (ref 6.0–8.3)

## 2014-11-17 LAB — URINALYSIS, ROUTINE W REFLEX MICROSCOPIC
Bilirubin Urine: NEGATIVE
Glucose, UA: 100 mg/dL — AB
Hgb urine dipstick: NEGATIVE
Ketones, ur: NEGATIVE mg/dL
Leukocytes, UA: NEGATIVE
Nitrite: NEGATIVE
Protein, ur: NEGATIVE mg/dL
Specific Gravity, Urine: 1.017 (ref 1.005–1.030)
Urobilinogen, UA: 0.2 mg/dL (ref 0.0–1.0)
pH: 5.5 (ref 5.0–8.0)

## 2014-11-17 LAB — TYPE AND SCREEN
ABO/RH(D): O POS
Antibody Screen: NEGATIVE

## 2014-11-17 LAB — CBC WITH DIFFERENTIAL/PLATELET
Basophils Absolute: 0 10*3/uL (ref 0.0–0.1)
Basophils Relative: 1 % (ref 0–1)
Eosinophils Absolute: 0.1 10*3/uL (ref 0.0–0.7)
Eosinophils Relative: 1 % (ref 0–5)
HCT: 43.5 % (ref 39.0–52.0)
Hemoglobin: 15.6 g/dL (ref 13.0–17.0)
Lymphocytes Relative: 17 % (ref 12–46)
Lymphs Abs: 1.3 10*3/uL (ref 0.7–4.0)
MCH: 30.8 pg (ref 26.0–34.0)
MCHC: 35.9 g/dL (ref 30.0–36.0)
MCV: 86 fL (ref 78.0–100.0)
Monocytes Absolute: 0.6 10*3/uL (ref 0.1–1.0)
Monocytes Relative: 8 % (ref 3–12)
Neutro Abs: 5.8 10*3/uL (ref 1.7–7.7)
Neutrophils Relative %: 73 % (ref 43–77)
Platelets: 248 10*3/uL (ref 150–400)
RBC: 5.06 MIL/uL (ref 4.22–5.81)
RDW: 13 % (ref 11.5–15.5)
WBC: 7.7 10*3/uL (ref 4.0–10.5)

## 2014-11-17 LAB — POC OCCULT BLOOD, ED: FECAL OCCULT BLD: POSITIVE — AB

## 2014-11-17 LAB — ABO/RH: ABO/RH(D): O POS

## 2014-11-17 MED ORDER — DOCUSATE SODIUM 100 MG PO CAPS
100.0000 mg | ORAL_CAPSULE | Freq: Two times a day (BID) | ORAL | Status: DC
  Administered 2014-11-17 – 2014-11-19 (×4): 100 mg via ORAL
  Filled 2014-11-17 (×4): qty 1

## 2014-11-17 MED ORDER — POTASSIUM CHLORIDE CRYS ER 20 MEQ PO TBCR
40.0000 meq | EXTENDED_RELEASE_TABLET | Freq: Once | ORAL | Status: AC
  Administered 2014-11-17: 40 meq via ORAL
  Filled 2014-11-17: qty 2

## 2014-11-17 MED ORDER — SODIUM CHLORIDE 0.9 % IV SOLN
INTRAVENOUS | Status: DC
  Administered 2014-11-17 – 2014-11-18 (×2): via INTRAVENOUS

## 2014-11-17 MED ORDER — NIFEDIPINE ER 60 MG PO TB24
60.0000 mg | ORAL_TABLET | Freq: Two times a day (BID) | ORAL | Status: DC
  Administered 2014-11-17 – 2014-11-19 (×4): 60 mg via ORAL
  Filled 2014-11-17 (×6): qty 1

## 2014-11-17 MED ORDER — SODIUM CHLORIDE 0.9 % IV BOLUS (SEPSIS)
500.0000 mL | Freq: Once | INTRAVENOUS | Status: AC
  Administered 2014-11-17: 500 mL via INTRAVENOUS

## 2014-11-17 MED ORDER — SODIUM CHLORIDE 0.9 % IJ SOLN
3.0000 mL | Freq: Two times a day (BID) | INTRAMUSCULAR | Status: DC
  Administered 2014-11-18 – 2014-11-19 (×3): 3 mL via INTRAVENOUS

## 2014-11-17 MED ORDER — ZOLPIDEM TARTRATE 5 MG PO TABS
5.0000 mg | ORAL_TABLET | Freq: Every evening | ORAL | Status: DC | PRN
  Administered 2014-11-17 – 2014-11-18 (×2): 5 mg via ORAL
  Filled 2014-11-17 (×2): qty 1

## 2014-11-17 MED ORDER — ADULT MULTIVITAMIN W/MINERALS CH
1.0000 | ORAL_TABLET | Freq: Every day | ORAL | Status: DC
  Administered 2014-11-18 – 2014-11-19 (×2): 1 via ORAL
  Filled 2014-11-17 (×3): qty 1

## 2014-11-17 MED ORDER — POLYETHYLENE GLYCOL 3350 17 G PO PACK
17.0000 g | PACK | Freq: Every day | ORAL | Status: DC
Start: 2014-11-17 — End: 2014-11-19
  Administered 2014-11-18: 17 g via ORAL
  Filled 2014-11-17 (×3): qty 1

## 2014-11-17 MED ORDER — SENNA 8.6 MG PO TABS
1.0000 | ORAL_TABLET | Freq: Two times a day (BID) | ORAL | Status: DC
  Administered 2014-11-17 – 2014-11-19 (×4): 8.6 mg via ORAL
  Filled 2014-11-17 (×6): qty 1

## 2014-11-17 NOTE — ED Notes (Signed)
LUNCH ORDERED FOR PATIENT

## 2014-11-17 NOTE — H&P (Signed)
Date: 11/17/2014               Patient Name:  Wesley Reynolds MRN: [id number]  DOB: 1931-07-12 Age / Sex: 81 y.o., male   PCP: Sharilyn Sites, MD         Medical Service: Internal Medicine Teaching Service         Attending Physician: Dr. Thayer Headings, MD    First Contact: Dr. Venita Lick Pager: [phone]  Second Contact: Dr. Duwaine Maxin Pager: [phone]       After Hours (After 5p/  First Contact Pager: [phone]  weekends / holidays): Second Contact Pager: [phone]   Chief Complaint: rectal bleeding this morning  History of Present Illness: Wesley Reynolds is an 81 yo man with a history of hypertension, OA and BPH who presented to the [facility] ED after an episode of dark red blood with a BM this morning. He had noticed darker stools than usual over the past 2 weeks, but this was the first episode during which he saw blood. He describes that the blood filled the toilet bowl, but admits that it was hard to estimate the amount. He has had some diffuse abdominal soreness, which he attributed to his exercise routine, and has had decreased energy, dizziness on standing and a 15 pound weight loss over the past several months. Of note, he has been taking 2400 mg ibuprofen daily for his osteoarthritic pain for 12 of the past 14 days. He has also noted increased constipation, particularly several weeks ago when he was drinking Boost shakes (which he has since stopped drinking).  Of note, his last colonoscopy was in 2014 with Dr. Laural Golden during which he was found to have sigmoid diverticula, colonic adenomas and hemorrhoids. Prior to that colonoscopy, he had one in 2009 (during which 4 small polyps were removed). He has no family history of colorectal carcinoma. He has never been a smoker.   Meds: No current facility-administered medications for this encounter.   Current Outpatient Prescriptions  Medication Sig Dispense Refill  . aspirin EC 81 MG tablet Take 81 mg by mouth daily.    Marland Kitchen ibuprofen  (ADVIL,MOTRIN) 600 MG tablet Take 600 mg by mouth every 6 (six) hours as needed for mild pain.    . Multiple Vitamins-Minerals (MULTIVITAMINS THER. W/MINERALS) TABS Take 1 tablet by mouth daily.    Marland Kitchen NIFEdipine (PROCARDIA XL/ADALAT-CC) 60 MG 24 hr tablet Take 60 mg by mouth 2 (two) times daily.    Marland Kitchen zolpidem (AMBIEN) 5 MG tablet Take 5 mg by mouth at bedtime as needed for sleep.      Allergies: Allergies as of 11/17/2014  . (No Known Allergies)   Past Medical History  Diagnosis Date  . Hypertension   . BPH (benign prostatic hyperplasia)    Past Surgical History  Procedure Laterality Date  . Colonoscopy N/A 03/26/2013    Procedure: COLONOSCOPY;  Surgeon: Rogene Houston, MD;  Location: [facility];  Service: Endoscopy;  Laterality: N/A;  830   History reviewed. No pertinent family history. History   Social History  . Marital Status: Married    Spouse Name: N/A    Number of Children: N/A  . Years of Education: N/A   Occupational History  . Not on file.   Social History Main Topics  . Smoking status: Never Smoker   . Smokeless tobacco: Not on file  . Alcohol Use: No  . Drug Use: Not on file  . Sexual Activity: Not  on file   Other Topics Concern  . Not on file   Social History Narrative    Review of Systems: General: decreased energy, weight loss Skin: SKs that have been evaluated by dermatology HEENT: no headaches, no changes in vision Cardiac: no chest pain, no palpitations Respiratory: no shortness of breath GI: diffusely sore abdomen, thinks it may be due to exercising Urinary: increased frequency, no dysuria or hematuria Msk: osteoarthritis Psychiatric: depressed mood, some difficulty falling asleep   Physical Exam: Blood pressure 142/77, pulse 72, temperature 98 F (36.7 C), temperature source Oral, resp. rate 17, SpO2 98 %. Appearance: in NAD, eating with wife, daughter and great-grand-daughter at bedside HEENT: AT/[place], PERRL, EOMi, no lymphadenopathy,  SKs on forehead and diffusely on face, MMM Heart: RRR, normal S1S2, no MRG Lungs: CTAB, no wheezes Abdomen: BS+, soft, diffusely slightly tender, no organomegaly, 7" vertical scar (s/p cholescysectomy and appendectomy) with nonpainful, reducible hernia Extremities: normal range of motion with no edema Neurologic: A&Ox3, some confusion when questioned about timeline of his history, grossly intact Skin: Large SKs diffusely  Lab results: Basic Metabolic Panel:  Recent Labs  11/17/14 0923  NA 138  K 3.2*  CL 101  CO2 27  GLUCOSE 148*  BUN 19  CREATININE 0.88  CALCIUM 9.7   Liver Function Tests:  Recent Labs  11/17/14 0923  AST 28  ALT 26  ALKPHOS 89  BILITOT 0.7  PROT 7.7  ALBUMIN 4.6   CBC:  Recent Labs  11/17/14 0923  WBC 7.7  NEUTROABS 5.8  HGB 15.6  HCT 43.5  MCV 86.0  PLT 248   Urinalysis:  Recent Labs  11/17/14 1018  COLORURINE YELLOW  LABSPEC 1.017  PHURINE 5.5  GLUCOSEU 100*  HGBUR NEGATIVE  BILIRUBINUR NEGATIVE  KETONESUR NEGATIVE  PROTEINUR NEGATIVE  UROBILINOGEN 0.2  NITRITE NEGATIVE  LEUKOCYTESUR NEGATIVE   Assessment & Plan by Problem: Active Problems:   Rectal bleeding   Dysphagia  Wesley Reynolds is an 81 yo man who had an episode of dark blood with a BM this morning after taking 2400 mg ibuprofen per day for 12 of the past 14 days. He has some symptoms that could be consistent with anemia in his dizziness and decreased energy. However, his hemoglobin is 15.6. He also has been experiencing some dysphagia and weight loss. On his past colonoscopies in 2009 and 2014, he has been found to have hemorrhoids, diverticula and adenomas (which were removed in 2009). His dysphagia and weight loss are also concerning for esophageal malignancy or other esophageal etiology.  Rectal Bleeding: Patient presented with an episode of dark blood with a BM this morning. His last colonoscopy was in 2014 with Dr. Laural Golden during which he was found to have  sigmoid diverticula, colonic adenomas and hemorrhoids. Prior to that colonoscopy, he had one in 2009 (during which 4 small polyps were removed). He has no family history of colorectal carcinoma. He has never been a smoker. He has chronic constipation for which he takes prune juice and miralax. No external hemorrhoids on exam, but gross blood on ED rectal exam with FOBT +. Hgb 15.6 (down from 18 2 months ago). He does report decreased energy and some dizziness on standing. Dark red bleeding could be from upper GI bleed (patient reports dysphagia, see below), diverticular bleed or hemorrhoids. - Appreciate GI consult - Trend CBCs - IV NS bolus 500 ml - Hold home ibuprofen (800 mg by mouth TID) - Miralax, Senokot and Colace for bowel regimen -  Orthostatic vital signs  Dysphagia: Patient reports difficulty swallowing, with food "sticking" in his throat at times. An upper GI bleed could be the cause of his darkened stool and even the dark blood with his BM today. - Appreciate GI consult - CXR to evaluate for aspiration - NPO - SLP evaluation  Weight Loss: Estimates a 15-20 lb weight loss over the past several months. Was previously taking Boost, but thought the drinks were causing constipation, so he stopped.  - Continue to monitor as cause of bleed is investigated  Hypokalemia: Currently 3.2. Baseline 4.5 (2 months ago). - Kdur supplementation - Trend BMET  Osteoarthritis: Patient reports pain in his joints. Recently prescribed 800 mg TID and has taken it (with a 2 day hiatus for refill) over the past 2 weeks - Hold ibuprofen  Hypertension: currently 142/77 - Continue home nifedipine 60 mg by mouth BID with acute rectal bleeding   Depressed Mood and Insomnia: Patient and patient's wife are concerned that he may be depressed, particularly before he takes his Ambien. He also has had a difficult time falling asleep. - Continue home Ambien 5 mg by mouth at bedtime PRN  BPH: Patient reports  frequent urination - Continue to monitor - Follows with urology  Diet: NPO until SLP  DVT Ppx: SCDs in context of bleeding  Dispo: Disposition is deferred at this time, awaiting improvement of current medical problems. Anticipated discharge in approximately 2 day(s).   The patient does have a current PCP Sharilyn Sites, MD) and does not need an [facility] hospital follow-up appointment after discharge.  The patient does not have transportation limitations that hinder transportation to clinic appointments.  Signed: Drucilla Schmidt, MD 11/17/2014, 3:07 PM

## 2014-11-17 NOTE — ED Provider Notes (Signed)
CSN: [id number]     Arrival date & time 11/17/14  0857 History   First MD Initiated Contact with Patient 11/17/14 [phone]     Chief Complaint  Patient presents with  . Rectal Bleeding     (Consider location/radiation/quality/duration/timing/severity/associated sxs/prior Treatment) The history is provided by the patient and medical records.     Patient with hx HTN, colonic adenomas, hemorrhoids last colonoscopy 2014 (Dr Laural Golden), p/w rectal bleeding.  States he has been feeling well with exception of sleep disturbance and this morning pass one normal stool with large amount of dark blood around it in and in the toilet water.  Denies fevers, chills, myalgias, abdominal pain, diarrhea, N/V, urinary symptoms.  Has had upper back pain but no low back pain x 1 week.  Takes Ibuprofen 600mg  TID for arthritis.  Also takes prune juice, miralax as chronic bowel regimen.    Past Medical History  Diagnosis Date  . Hypertension   . BPH (benign prostatic hyperplasia)    Past Surgical History  Procedure Laterality Date  . Colonoscopy N/A 03/26/2013    Procedure: COLONOSCOPY;  Surgeon: Rogene Houston, MD;  Location: [facility];  Service: Endoscopy;  Laterality: N/A;  830   History reviewed. No pertinent family history. History  Substance Use Topics  . Smoking status: Never Smoker   . Smokeless tobacco: Not on file  . Alcohol Use: No    Review of Systems  All other systems reviewed and are negative.     Allergies  Review of patient's allergies indicates no known allergies.  Home Medications   Prior to Admission medications   Medication Sig Start Date End Date Taking? Authorizing Provider  aspirin EC 81 MG tablet Take 81 mg by mouth daily.    Historical Provider, MD  Multiple Vitamins-Minerals (MULTIVITAMINS THER. W/MINERALS) TABS Take 1 tablet by mouth daily.    Historical Provider, MD  NIFEdipine (PROCARDIA-XL/ADALAT-CC/NIFEDICAL-XL) 30 MG 24 hr tablet Take 30 mg by mouth daily.     Historical Provider, MD  omeprazole (PRILOSEC) 40 MG capsule Take 40 mg by mouth 2 (two) times daily.    Historical Provider, MD   BP 152/78 mmHg  Pulse 79  Temp(Src) 98 F (36.7 C) (Oral)  Resp 22  SpO2 99% Physical Exam  Constitutional: He appears well-developed and well-nourished. No distress.  HENT:  Head: Normocephalic and atraumatic.  Neck: Neck supple.  Cardiovascular: Normal rate and regular rhythm.   Pulmonary/Chest: Effort normal and breath sounds normal. No respiratory distress. He has no wheezes. He has no rales.  Abdominal: Soft. He exhibits no distension and no mass. There is no tenderness. There is no rebound and no guarding.  Genitourinary: Rectal exam shows no external hemorrhoid, no internal hemorrhoid, no mass, no tenderness and anal tone normal.  Rectal exam: Thick dark blood on glove  Neurological: He is alert. He exhibits normal muscle tone.  Skin: He is not diaphoretic.  Nursing note and vitals reviewed.   ED Course  Procedures (including critical care time) Labs Review Labs Reviewed  COMPREHENSIVE METABOLIC PANEL - Abnormal; Notable for the following:    Potassium 3.2 (*)    Glucose, Bld 148 (*)    GFR calc non Af Amer 78 (*)    All other components within normal limits  URINALYSIS, ROUTINE W REFLEX MICROSCOPIC - Abnormal; Notable for the following:    Glucose, UA 100 (*)    All other components within normal limits  POC OCCULT BLOOD, ED - Abnormal; Notable for  the following:    Fecal Occult Bld POSITIVE (*)    All other components within normal limits  CBC WITH DIFFERENTIAL/PLATELET  TYPE AND SCREEN  ABO/RH    Imaging Review No results found.   EKG Interpretation None       10:39 AM Discussed pt with Dr Wilson Singer.    MDM   Final diagnoses:  Rectal bleeding   Afebrile nontoxic patient with hx diverticula, hemorrhoids, colonic adenomas, presents with single episode of dark red rectal bleeding.  No abdominal pain or tenderness.  Gross blood  on exam.  Hgb is normal.  He remained normotensive.  Given his age and the gross blood on exam and some vague fatigue he has been feeling, patient admitted for observation.  He is on daily aspirin and TID 600mg  ibuprofen.  Admitted to [facility] Internal Medicine Teaching Service.  (unassigned)  PCP is Dr Hilma Favors in [place].     Clayton Bibles, PA-C 11/17/14 1437  Virgel Manifold, MD 11/22/14 [phone]

## 2014-11-17 NOTE — ED Notes (Signed)
Pt presents to department for evaluation of rectal bleeding. States several episodes of dark tarry stools this morning. Denies abdominal pain. Pt is alert and oriented x4.

## 2014-11-18 ENCOUNTER — Encounter (HOSPITAL_COMMUNITY): Payer: Self-pay | Admitting: General Practice

## 2014-11-18 DIAGNOSIS — G4700 Insomnia, unspecified: Secondary | ICD-10-CM

## 2014-11-18 DIAGNOSIS — N400 Enlarged prostate without lower urinary tract symptoms: Secondary | ICD-10-CM | POA: Diagnosis not present

## 2014-11-18 DIAGNOSIS — F329 Major depressive disorder, single episode, unspecified: Secondary | ICD-10-CM | POA: Diagnosis not present

## 2014-11-18 DIAGNOSIS — I10 Essential (primary) hypertension: Secondary | ICD-10-CM | POA: Diagnosis not present

## 2014-11-18 DIAGNOSIS — M1990 Unspecified osteoarthritis, unspecified site: Secondary | ICD-10-CM

## 2014-11-18 DIAGNOSIS — K625 Hemorrhage of anus and rectum: Secondary | ICD-10-CM | POA: Diagnosis not present

## 2014-11-18 DIAGNOSIS — R634 Abnormal weight loss: Secondary | ICD-10-CM

## 2014-11-18 DIAGNOSIS — E876 Hypokalemia: Secondary | ICD-10-CM | POA: Diagnosis not present

## 2014-11-18 DIAGNOSIS — R1310 Dysphagia, unspecified: Principal | ICD-10-CM

## 2014-11-18 DIAGNOSIS — K921 Melena: Secondary | ICD-10-CM | POA: Diagnosis not present

## 2014-11-18 LAB — CBC
HCT: 38 % — ABNORMAL LOW (ref 39.0–52.0)
HCT: 39.6 % (ref 39.0–52.0)
HEMATOCRIT: 39.1 % (ref 39.0–52.0)
Hemoglobin: 13.6 g/dL (ref 13.0–17.0)
Hemoglobin: 13.3 g/dL (ref 13.0–17.0)
Hemoglobin: 13.7 g/dL (ref 13.0–17.0)
MCH: 30.2 pg (ref 26.0–34.0)
MCH: 31 pg (ref 26.0–34.0)
MCH: 30.7 pg (ref 26.0–34.0)
MCHC: 34.8 g/dL (ref 30.0–36.0)
MCHC: 35 g/dL (ref 30.0–36.0)
MCHC: 34.6 g/dL (ref 30.0–36.0)
MCV: 86.9 fL (ref 78.0–100.0)
MCV: 88.6 fL (ref 78.0–100.0)
MCV: 88.8 fL (ref 78.0–100.0)
PLATELETS: 204 10*3/uL (ref 150–400)
Platelets: 199 10*3/uL (ref 150–400)
Platelets: 212 10*3/uL (ref 150–400)
RBC: 4.5 MIL/uL (ref 4.22–5.81)
RBC: 4.29 MIL/uL (ref 4.22–5.81)
RBC: 4.46 MIL/uL (ref 4.22–5.81)
RDW: 13.1 % (ref 11.5–15.5)
RDW: 13.2 % (ref 11.5–15.5)
RDW: 13.1 % (ref 11.5–15.5)
WBC: 7 10*3/uL (ref 4.0–10.5)
WBC: 7.6 10*3/uL (ref 4.0–10.5)
WBC: 6.3 10*3/uL (ref 4.0–10.5)

## 2014-11-18 LAB — BASIC METABOLIC PANEL
Anion gap: 7 (ref 5–15)
BUN: 13 mg/dL (ref 6–23)
CHLORIDE: 108 mmol/L (ref 96–112)
CO2: 25 mmol/L (ref 19–32)
Calcium: 8.7 mg/dL (ref 8.4–10.5)
Creatinine, Ser: 0.74 mg/dL (ref 0.50–1.35)
GFR calc non Af Amer: 84 mL/min — ABNORMAL LOW (ref >90)
Glucose, Bld: 113 mg/dL — ABNORMAL HIGH (ref 70–99)
POTASSIUM: 3.6 mmol/L (ref 3.5–5.1)
Sodium: 140 mmol/L (ref 135–145)

## 2014-11-18 MED ORDER — PANTOPRAZOLE SODIUM 40 MG PO TBEC
40.0000 mg | DELAYED_RELEASE_TABLET | Freq: Two times a day (BID) | ORAL | Status: DC
  Administered 2014-11-18 – 2014-11-19 (×3): 40 mg via ORAL
  Filled 2014-11-18 (×3): qty 1

## 2014-11-18 MED ORDER — BOOST / RESOURCE BREEZE PO LIQD
1.0000 | Freq: Three times a day (TID) | ORAL | Status: DC
  Administered 2014-11-18 – 2014-11-19 (×2): 1 via ORAL

## 2014-11-18 NOTE — Progress Notes (Signed)
UR completed 

## 2014-11-18 NOTE — Consult Note (Signed)
[facility] Consult Note  Referring Provider: No ref. provider found Primary Care Physician:  Purvis Kilts, MD Primary Gastroenterologist:  Dr.  Laurel Dimmer Complaint: Rectal bleeding HPI: Wesley Reynolds is an 81 y.o. white male  with a history of diverticulosis and colon polyps on colonoscopy in 2014 who presents with onset of painless hematochezia described as bright to dark red with no melena, no abdominal pain and no weakness or near syncope. He does use nonsteroidal anti-inflammatory drugs. He is not on any blood thinners  Past Medical History  Diagnosis Date  . Hypertension   . BPH (benign prostatic hyperplasia)   . GI bleed 11/2014    Past Surgical History  Procedure Laterality Date  . Colonoscopy N/A 03/26/2013    Procedure: COLONOSCOPY;  Surgeon: Rogene Houston, MD;  Location: [facility];  Service: Endoscopy;  Laterality: N/A;  830  . Cholecystectomy    . Appendectomy    . Cyst excision  1960 ?    spine     Medications Prior to Admission  Medication Sig Dispense Refill  . aspirin EC 81 MG tablet Take 81 mg by mouth daily.    Marland Kitchen ibuprofen (ADVIL,MOTRIN) 600 MG tablet Take 600 mg by mouth every 6 (six) hours as needed for mild pain.    . Multiple Vitamins-Minerals (MULTIVITAMINS THER. W/MINERALS) TABS Take 1 tablet by mouth daily.    Marland Kitchen NIFEdipine (PROCARDIA XL/ADALAT-CC) 60 MG 24 hr tablet Take 60 mg by mouth 2 (two) times daily.    Marland Kitchen zolpidem (AMBIEN) 5 MG tablet Take 5 mg by mouth at bedtime as needed for sleep.      Allergies: No Known Allergies  History reviewed. No pertinent family history.  Social History:  reports that he has never smoked. He has never used smokeless tobacco. He reports that he does not drink alcohol. His drug history is not on file.  Review of Systems: negative except as above   Blood pressure 119/70, pulse 70, temperature 98.6 F (37 C), temperature source Oral, resp. rate 17, height 5' 9" (1.753 m), weight 69.582 kg (153 lb  6.4 oz), SpO2 99 %. Head: Normocephalic, without obvious abnormality, atraumatic Neck: no adenopathy, no carotid bruit, no JVD, supple, symmetrical, trachea midline and thyroid not enlarged, symmetric, no tenderness/mass/nodules Resp: clear to auscultation bilaterally Cardio: regular rate and rhythm, S1, S2 normal, no murmur, click, rub or gallop GI: Abdomen soft nondistended with normoactive bowel sounds. No hepatomegaly masses or guarding Extremities: extremities normal, atraumatic, no cyanosis or edema  Results for orders placed or performed during the hospital encounter of 11/17/14 (from the past 48 hour(s))  Type and screen     Status: None   Collection Time: 11/17/14  9:17 AM  Result Value Ref Range   ABO/RH(D) O POS    Antibody Screen NEG    Sample Expiration 11/20/2014   ABO/Rh     Status: None   Collection Time: 11/17/14  9:17 AM  Result Value Ref Range   ABO/RH(D) O POS   CBC with Differential     Status: None   Collection Time: 11/17/14  9:23 AM  Result Value Ref Range   WBC 7.7 4.0 - 10.5 K/uL   RBC 5.06 4.22 - 5.81 MIL/uL   Hemoglobin 15.6 13.0 - 17.0 g/dL   HCT 43.5 39.0 - 52.0 %   MCV 86.0 78.0 - 100.0 fL   MCH 30.8 26.0 - 34.0 pg   MCHC 35.9 30.0 - 36.0 g/dL   RDW  13.0 11.5 - 15.5 %   Platelets 248 150 - 400 K/uL   Neutrophils Relative % 73 43 - 77 %   Neutro Abs 5.8 1.7 - 7.7 K/uL   Lymphocytes Relative 17 12 - 46 %   Lymphs Abs 1.3 0.7 - 4.0 K/uL   Monocytes Relative 8 3 - 12 %   Monocytes Absolute 0.6 0.1 - 1.0 K/uL   Eosinophils Relative 1 0 - 5 %   Eosinophils Absolute 0.1 0.0 - 0.7 K/uL   Basophils Relative 1 0 - 1 %   Basophils Absolute 0.0 0.0 - 0.1 K/uL  Comprehensive metabolic panel     Status: Abnormal   Collection Time: 11/17/14  9:23 AM  Result Value Ref Range   Sodium 138 135 - 145 mmol/L   Potassium 3.2 (L) 3.5 - 5.1 mmol/L   Chloride 101 96 - 112 mmol/L   CO2 27 19 - 32 mmol/L   Glucose, Bld 148 (H) 70 - 99 mg/dL   BUN 19 6 - 23 mg/dL    Creatinine, Ser 0.88 0.50 - 1.35 mg/dL   Calcium 9.7 8.4 - 10.5 mg/dL   Total Protein 7.7 6.0 - 8.3 g/dL   Albumin 4.6 3.5 - 5.2 g/dL   AST 28 0 - 37 U/L   ALT 26 0 - 53 U/L   Alkaline Phosphatase 89 39 - 117 U/L   Total Bilirubin 0.7 0.3 - 1.2 mg/dL   GFR calc non Af Amer 78 (L) >90 mL/min   GFR calc Af Amer >90 >90 mL/min    Comment: (NOTE) The eGFR has been calculated using the CKD EPI equation. This calculation has not been validated in all clinical situations. eGFR's persistently <90 mL/min signify possible Chronic Kidney Disease.    Anion gap 10 5 - 15  POC occult blood, ED Provider will collect     Status: Abnormal   Collection Time: 11/17/14  9:56 AM  Result Value Ref Range   Fecal Occult Bld POSITIVE (A) NEGATIVE  Urinalysis, Routine w reflex microscopic     Status: Abnormal   Collection Time: 11/17/14 10:18 AM  Result Value Ref Range   Color, Urine YELLOW YELLOW   APPearance CLEAR CLEAR   Specific Gravity, Urine 1.017 1.005 - 1.030   pH 5.5 5.0 - 8.0   Glucose, UA 100 (A) NEGATIVE mg/dL   Hgb urine dipstick NEGATIVE NEGATIVE   Bilirubin Urine NEGATIVE NEGATIVE   Ketones, ur NEGATIVE NEGATIVE mg/dL   Protein, ur NEGATIVE NEGATIVE mg/dL   Urobilinogen, UA 0.2 0.0 - 1.0 mg/dL   Nitrite NEGATIVE NEGATIVE   Leukocytes, UA NEGATIVE NEGATIVE    Comment: MICROSCOPIC NOT DONE ON URINES WITH NEGATIVE PROTEIN, BLOOD, LEUKOCYTES, NITRITE, OR GLUCOSE <1000 mg/dL.  CBC     Status: Abnormal   Collection Time: 11/17/14  6:44 PM  Result Value Ref Range   WBC 6.3 4.0 - 10.5 K/uL   RBC 4.33 4.22 - 5.81 MIL/uL   Hemoglobin 13.4 13.0 - 17.0 g/dL   HCT 37.2 (L) 39.0 - 52.0 %   MCV 85.9 78.0 - 100.0 fL   MCH 30.9 26.0 - 34.0 pg   MCHC 36.0 30.0 - 36.0 g/dL   RDW 13.0 11.5 - 15.5 %   Platelets 219 150 - 400 K/uL  CBC     Status: Abnormal   Collection Time: 11/17/14  9:20 PM  Result Value Ref Range   WBC 6.5 4.0 - 10.5 K/uL   RBC 4.33 4.22 - 5.81   MIL/uL   Hemoglobin 13.4  13.0 - 17.0 g/dL   HCT 37.3 (L) 39.0 - 52.0 %   MCV 86.1 78.0 - 100.0 fL   MCH 30.9 26.0 - 34.0 pg   MCHC 35.9 30.0 - 36.0 g/dL   RDW 13.0 11.5 - 15.5 %   Platelets 206 150 - 400 K/uL  CBC     Status: None   Collection Time: 11/18/14  3:26 AM  Result Value Ref Range   WBC 7.0 4.0 - 10.5 K/uL   RBC 4.50 4.22 - 5.81 MIL/uL   Hemoglobin 13.6 13.0 - 17.0 g/dL   HCT 39.1 39.0 - 52.0 %   MCV 86.9 78.0 - 100.0 fL   MCH 30.2 26.0 - 34.0 pg   MCHC 34.8 30.0 - 36.0 g/dL   RDW 13.1 11.5 - 15.5 %   Platelets 204 150 - 400 K/uL  Basic metabolic panel     Status: Abnormal   Collection Time: 11/18/14  3:26 AM  Result Value Ref Range   Sodium 140 135 - 145 mmol/L   Potassium 3.6 3.5 - 5.1 mmol/L   Chloride 108 96 - 112 mmol/L   CO2 25 19 - 32 mmol/L   Glucose, Bld 113 (H) 70 - 99 mg/dL   BUN 13 6 - 23 mg/dL   Creatinine, Ser 0.74 0.50 - 1.35 mg/dL   Calcium 8.7 8.4 - 10.5 mg/dL   GFR calc non Af Amer 84 (L) >90 mL/min   GFR calc Af Amer >90 >90 mL/min    Comment: (NOTE) The eGFR has been calculated using the CKD EPI equation. This calculation has not been validated in all clinical situations. eGFR's persistently <90 mL/min signify possible Chronic Kidney Disease.    Anion gap 7 5 - 15   Dg Chest 2 View  11/17/2014   CLINICAL DATA:  Dysphagia with intermittent coughing when eating solid foods. Blood per rectum today. Initial encounter.  EXAM: CHEST  2 VIEW  COMPARISON:  03/15/2009.  FINDINGS: The heart size and mediastinal contours are stable. There is stable mild elevation of the right hemidiaphragm. There is a stable small calcified granuloma at the right lung apex. The lungs are otherwise clear. There is no pleural effusion or pneumothorax. The bones appear unremarkable.  IMPRESSION: Stable chest.  No active cardiopulmonary process.   Electronically Signed   By: Camie Patience M.D.   On: 11/17/2014 16:14    Assessment: GI bleed very likely lower in source based on normal BUN stable  hemoglobin and hemodynamicstability as well as known diverticulosis Plan:  Empiric PPI treatment but will hold off on EGD for now. We'll keep on clear liquid diet and monitor stools and hemoglobin. If bleeding persist we will pursue either colonoscopy plus minus EGD or possible nuclear medicine bleeding scan. FYBOF,BPZW C 11/18/2014, 8:26 AM

## 2014-11-18 NOTE — Progress Notes (Signed)
Subjective: Wesley Reynolds feels well today. He had one further episode of bloody BM this morning. It was a slightly smaller amount of blood than yesterday's, but the stool was dark.  Asked more questions about his dysphagia, Wesley Reynolds admits that it is worst with meat, and only occurs with food (not with liquid).   Objective: Vital signs in last 24 hours: Filed Vitals:   11/17/14 1736 11/17/14 2100 11/18/14 0555 11/18/14 1357  BP: 119/68 124/63 119/70 143/72  Pulse: 76 78 70 91  Temp: 98 F (36.7 C) 98.3 F (36.8 C) 98.6 F (37 C) 97.4 F (36.3 C)  TempSrc: Oral Oral Oral Oral  Resp: 16 16 17 16   Height:      Weight:      SpO2: 100% 97% 99% 100%   Weight change:   Intake/Output Summary (Last 24 hours) at 11/18/14 1423 Last data filed at 11/18/14 0810  Gross per 24 hour  Intake    753 ml  Output    201 ml  Net    552 ml   Physical Exam: Appearance: in NAD, sitting in bed with wife and friend at bedside HEENT: AT/[name], PERRL, EOMi, no lymphadenopathy, SKs on forehead and diffusely on face, MMM Heart: RRR, normal S1S2, no MRG Lungs: CTAB, no wheezes Abdomen: BS+, soft, diffusely slightly tender, no organomegaly, 7" vertical scar (s/p cholescysectomy and appendectomy) with nonpainful, reducible hernia Extremities: normal range of motion with no edema Neurologic: A&Ox3, some confusion, but generally well-adjusted, grossly intact Skin: Large SKs diffusely  Lab Results: Basic Metabolic Panel:  Recent Labs Lab 11/17/14 0923 11/18/14 0326  NA 138 140  K 3.2* 3.6  CL 101 108  CO2 27 25  GLUCOSE 148* 113*  BUN 19 13  CREATININE 0.88 0.74  CALCIUM 9.7 8.7   Liver Function Tests:  Recent Labs Lab 11/17/14 0923  AST 28  ALT 26  ALKPHOS 89  BILITOT 0.7  PROT 7.7  ALBUMIN 4.6   CBC:  Recent Labs Lab 11/17/14 0923  11/18/14 0326 11/18/14 0918  WBC 7.7  < > 7.0 7.6  NEUTROABS 5.8  --   --   --   HGB 15.6  < > 13.6 13.3  HCT 43.5  < > 39.1 38.0*  MCV 86.0   < > 86.9 88.6  PLT 248  < > 204 199  < > = values in this interval not displayed. Urinalysis:  Recent Labs Lab 11/17/14 1018  COLORURINE YELLOW  LABSPEC 1.017  PHURINE 5.5  GLUCOSEU 100*  HGBUR NEGATIVE  BILIRUBINUR NEGATIVE  KETONESUR NEGATIVE  PROTEINUR NEGATIVE  UROBILINOGEN 0.2  NITRITE NEGATIVE  LEUKOCYTESUR NEGATIVE   Studies/Results: Dg Chest 2 View  11/17/2014   CLINICAL DATA:  Dysphagia with intermittent coughing when eating solid foods. Blood per rectum today. Initial encounter.  EXAM: CHEST  2 VIEW  COMPARISON:  03/15/2009.  FINDINGS: The heart size and mediastinal contours are stable. There is stable mild elevation of the right hemidiaphragm. There is a stable small calcified granuloma at the right lung apex. The lungs are otherwise clear. There is no pleural effusion or pneumothorax. The bones appear unremarkable.  IMPRESSION: Stable chest.  No active cardiopulmonary process.   Electronically Signed   By: Camie Patience M.D.   On: 11/17/2014 16:14   Medications: I have reviewed the patient's current medications. Scheduled Meds: . docusate sodium  100 mg Oral BID  . multivitamin with minerals  1 tablet Oral Daily  . NIFEdipine  60  mg Oral BID  . pantoprazole  40 mg Oral BID  . polyethylene glycol  17 g Oral Daily  . senna  1 tablet Oral BID  . sodium chloride  3 mL Intravenous Q12H   Continuous Infusions:  PRN Meds:.zolpidem Assessment/Plan: Principal Problem:   Rectal bleeding Active Problems:   Dysphagia   Essential hypertension   BPH (benign prostatic hyperplasia)   Insomnia Mr. Wesley Reynolds is an 81 yo man who had an episode of dark blood with a BM this morning after taking 2400 mg ibuprofen per day for 12 of the past 14 days. His hemoglobin is 15.6 and his symptoms of dizziness have improved, but his prior hemoglobin 2 months ago was 18. He also has been experiencing some dysphagia with solid foods (meat) and weight loss. On his past colonoscopies in 2009 and  2014, he has been found to have hemorrhoids, diverticula and adenomas (which were removed in 2009). His dysphagia and weight loss are also concerning for esophageal malignancy or other esophageal etiology.  Rectal Bleeding: Patient presented with an episode of dark blood with a BM this morning. His last colonoscopy was in 2014 with Dr. Laural Golden during which he was found to have sigmoid diverticula, colonic adenomas and hemorrhoids. Prior to that colonoscopy, he had one in 2009 (during which 4 small polyps were removed). He has no family history of colorectal carcinoma. He has never been a smoker. He has chronic constipation for which he takes prune juice and miralax. No external hemorrhoids on exam, but gross blood on ED rectal exam with FOBT +. Hgb 15.6-->13.3 (down from 18 2 months ago). He does report decreased energy and some dizziness on standing. Dark red bleeding could be from upper GI bleed (patient reports dysphagia, see below), diverticular bleed or hemorrhoids. - Appreciate GI consult - Trend CBCs q8 hours  - IV NS bolus 500 ml now completed; d/c IV to make patient more comfortable - D/c tele to make patient more comfortable - Hold home ibuprofen (800 mg by mouth TID) - Miralax, Senokot and Colace for bowel regimen - Orthostatic vital signs  Dysphagia: Patient reports difficulty swallowing, with food "sticking" in his throat at times. He does not have difficulty with liquids. This is suggestive of lower esophageal ring when intermittent and cancer or peptic stricture if progressive. An upper GI bleed could be the cause of his darkened stool and even the dark blood with his BM today. CXR to evaluate for aspiration was negative. - Appreciate GI consult - Clears diet - SLP evaluation  Weight Loss: Estimates a 15 lb weight loss over the past several months, weight has now stabilized. Was previously taking Boost, but thought the drinks were causing constipation, so he stopped.  - Continue to  monitor as cause of bleed is investigated  Hypokalemia: Currently 3.2-->3.6. Baseline 4.5 (2 months ago). - Kdur supplementation - Trend BMET  Osteoarthritis: Patient reports pain in his joints. Recently prescribed 800 mg TID and has taken it (with a 2 day hiatus for refill) over the past 2 weeks. - Hold ibuprofen  Hypertension: currently 143/72 - Continue home nifedipine 60 mg by mouth BID  Depressed Mood and Insomnia: Patient and patient's wife are concerned that he may be depressed, particularly before he takes his Ambien. He also has had a difficult time falling asleep. - Continue home Ambien 5 mg by mouth at bedtime PRN  BPH: Patient reports frequent urination. - Continue to monitor - Follows with urology  Diet: Clears  DVT Ppx: SCDs in context of bleeding  Dispo: Disposition is deferred at this time, awaiting improvement of current medical problems.  Anticipated discharge in approximately 1-2 day(s).   The patient does have a current PCP Sharilyn Sites, MD) and does not need an [facility] hospital follow-up appointment after discharge.  The patient does not know have transportation limitations that hinder transportation to clinic appointments.  .Services Needed at time of discharge: Y = Yes, Blank = No PT:   OT:   RN:   Equipment:   Other:     LOS: 1 day   Drucilla Schmidt, MD 11/18/2014, 2:23 PM

## 2014-11-18 NOTE — Progress Notes (Signed)
INITIAL NUTRITION ASSESSMENT  DOCUMENTATION CODES Per approved criteria  -Severe malnutrition in the context of acute illness or injury  Pt meets criteria for SEVERE MALNUTRITION in the context of ACUTE ILLNESS as evidenced by 12% weight loss in 2 months, moderate muscle wasting, and moderate loss of subcutaneous fat.  INTERVENTION: Provide Resource Breeze TID after meals, each supplement provides 250 kcal and 9 grams of protein Diet advancement per MD Encourage PO intake with protein-rich foods as tolerated RD to continue to monitor  NUTRITION DIAGNOSIS: Unintentional weight loss related to stress, anxiety, and loose stools as evidenced by 12% weight loss in 2 months.   Goal: Pt to meet >/= 90% of their estimated nutrition needs   Monitor:  Diet advancement, PO intake, weight trend, labs  Reason for Assessment: Malnutrition Screening Tool  81 y.o. male  Admitting Dx: Rectal bleeding  ASSESSMENT: 81 yo man with a history of hypertension, OA and BPH who presented to the [facility] ED after an episode of dark red blood with a BM.   Pt reports losing from 176 lbs prior to Christmas due to his current weight of 153 lbs. Weight history shows pt has lost 12% of his body weight in the past 2 months. He states that his appetite has been good and he has been eating his 3 normal meals daily. Pt has tried Boost supplements but, it caused constipation. He relates weight loss to stress and anxiety surrounding his issues with loose stools, high blood pressure, and difficulty sleeping which started a few months ago. When asked about his difficulty swallowing he stated it comes and goes.  Pt agreeable to trying Resource Breeze nutritional supplements.   Discussed diet tips for constipation. Encouraged intake of soft "p" fruits (peaches, pears, plums, pineapple) and well cooked vegetables and recommend pt stay well hydrated. Also encouraged frequent meals and snacks with protein-rich foods.  Recommended pt avoid popcorn, seeds and nuts; smooth nut butters okay.   Labs reviewed.   Nutrition Focused Physical Exam:  Subcutaneous Fat:  Orbital Region: mild wasting Upper Arm Region: moderate wasting Thoracic and Lumbar Region: moderate wasting  Muscle:  Temple Region: mild wasting Clavicle Bone Region: moderate wasting Clavicle and Acromion Bone Region: moderate to severe wasting Scapular Bone Region: NA Dorsal Hand: mild to moderate wasting Patellar Region: moderate wasting Anterior Thigh Region: moderate wasting Posterior Calf Region: moderate to severe wasting  Edema: none noted   Height: Ht Readings from Last 1 Encounters:  11/17/14 5\' 9"  (1.753 m)    Weight: Wt Readings from Last 1 Encounters:  11/17/14 153 lb 6.4 oz (69.582 kg)    Ideal Body Weight: 160 lbs  % Ideal Body Weight: 96%  Wt Readings from Last 10 Encounters:  11/17/14 153 lb 6.4 oz (69.582 kg)  09/17/14 174 lb (78.926 kg)  07/13/14 170 lb (77.111 kg)  03/26/13 174 lb (78.926 kg)    Usual Body Weight: 176 lbs  % Usual Body Weight: 87%  BMI:  Body mass index is 22.64 kg/(m^2).  Estimated Nutritional Needs: Kcal: 1700-1900 Protein: 85-100 grams Fluid: 1.7-1.9 L/day  Skin: intact  Diet Order: Diet clear liquid  EDUCATION NEEDS: -No education needs identified at this time   Intake/Output Summary (Last 24 hours) at 11/18/14 1513 Last data filed at 11/18/14 0810  Gross per 24 hour  Intake    753 ml  Output    201 ml  Net    552 ml    Last BM: PTA  Labs:  Recent Labs Lab 11/17/14 0923 11/18/14 0326  NA 138 140  K 3.2* 3.6  CL 101 108  CO2 27 25  BUN 19 13  CREATININE 0.88 0.74  CALCIUM 9.7 8.7  GLUCOSE 148* 113*    CBG (last 3)  No results for input(s): GLUCAP in the last 72 hours.  Scheduled Meds: . docusate sodium  100 mg Oral BID  . multivitamin with minerals  1 tablet Oral Daily  . NIFEdipine  60 mg Oral BID  . pantoprazole  40 mg Oral BID  .  polyethylene glycol  17 g Oral Daily  . senna  1 tablet Oral BID  . sodium chloride  3 mL Intravenous Q12H    Continuous Infusions:   Past Medical History  Diagnosis Date  . Hypertension   . BPH (benign prostatic hyperplasia)   . GI bleed 11/2014    Past Surgical History  Procedure Laterality Date  . Colonoscopy N/A 03/26/2013    Procedure: COLONOSCOPY;  Surgeon: Rogene Houston, MD;  Location: [facility];  Service: Endoscopy;  Laterality: N/A;  830  . Cholecystectomy    . Appendectomy    . Cyst excision  1960 ?    spine     Pryor Ochoa RD, LDN Inpatient Clinical Dietitian Pager: [phone] After Hours Pager: [phone]

## 2014-11-18 NOTE — Evaluation (Signed)
Clinical/Bedside Swallow Evaluation Patient Details  Name: Wesley Reynolds MRN: [id number] Date of Birth: 27-Jan-1931  Today's Date: 11/18/2014 Time: SLP Start Time (ACUTE ONLY): 68 SLP Stop Time (ACUTE ONLY): 1543 SLP Time Calculation (min) (ACUTE ONLY): 28 min  Past Medical History:  Past Medical History  Diagnosis Date  . Hypertension   . BPH (benign prostatic hyperplasia)   . GI bleed 11/2014   Past Surgical History:  Past Surgical History  Procedure Laterality Date  . Colonoscopy N/A 03/26/2013    Procedure: COLONOSCOPY;  Surgeon: Rogene Houston, MD;  Location: [facility];  Service: Endoscopy;  Laterality: N/A;  830  . Cholecystectomy    . Appendectomy    . Cyst excision  1960 ?    spine    HPI:  81 yo male admitted 11/17/14 after noticing blood with BM.  Pt and wife report h/o an intermittent dysphagia with meats and pills, and a 15 lb. weight loss over the past 4-5 months.   Assessment / Plan / Recommendation Clinical Impression  Patient admitted for GI bleed, but also reports ongoing (but intermittent) dysphagia with solids, particularly meats and pills, for the past 4-5 months.  Pt reports a 15 lb. weight loss also in that time frame.  Pt was able to swallow large consecutive sips of water via straw without difficulty, and denies ever having trouble with liquids.  Pt describes the sensation of food sticking in his throat, requiring several swallows of liquid to "get it down."  SLP did not assess solids at this time, as pt is on clear liquids only due to GI bleed.  Dr. Amedeo Plenty is following pt for the GI bleed.  Will defer further work-up and recommdations to GI.  Pt and wife were educated to possible tests that may be utilized for diagnosis and treatment plan for his solid food dysphagia.  Please reorder if SLP may be of further assist.    Aspiration Risk  Mild    Diet Recommendation Thin liquid   Liquid Administration via: Cup;Straw Medication Administration: Other (Comment)  (defer to MD) Supervision: Patient able to self feed Postural Changes and/or Swallow Maneuvers: Seated upright 90 degrees;Upright 30-60 min after meal    Other  Recommendations Recommended Consults: Consider esophageal assessment Oral Care Recommendations: Oral care BID   Follow Up Recommendations       Frequency and Duration        Pertinent Vitals/Pain n/a         Swallow Study Prior Functional Status       General Date of Onset:  (Since September or earlier.) HPI: 81 yo male admitted 11/17/14 after noticing blood with BM.  Pt and wife report h/o an intermittent dysphagia with meats and pills, and a 15 lb. weight loss over the past 4-5 months. Type of Study: Bedside swallow evaluation Previous Swallow Assessment: none Diet Prior to this Study: Thin liquids Temperature Spikes Noted: No Respiratory Status: Room air History of Recent Intubation: No Behavior/Cognition: Alert;Cooperative;Pleasant mood Oral Cavity - Dentition: Adequate natural dentition Self-Feeding Abilities: Able to feed self Patient Positioning: Upright in bed Baseline Vocal Quality: Clear Volitional Cough: Strong Volitional Swallow: Able to elicit    Oral/Motor/Sensory Function Overall Oral Motor/Sensory Function: Appears within functional limits for tasks assessed   Ice Chips Ice chips: Within functional limits Presentation: Cup   Thin Liquid Thin Liquid: Within functional limits Presentation: Straw    Nectar Thick Nectar Thick Liquid: Not tested   Honey Thick Honey Thick Liquid: Not  tested   Puree Puree: Not tested   Solid   GO Functional Assessment Tool Used: Clinical judgement Functional Limitations: Swallowing Swallow Current Status (T5521): At least 20 percent but less than 40 percent impaired, limited or restricted Swallow Goal Status [phone]): At least 20 percent but less than 40 percent impaired, limited or restricted Swallow Discharge Status [phone]): At least 20 percent but less than 40  percent impaired, limited or restricted  Solid: Not tested       Quinn Axe T 11/18/2014,4:01 PM

## 2014-11-19 ENCOUNTER — Observation Stay (HOSPITAL_COMMUNITY): Payer: Medicare Other

## 2014-11-19 DIAGNOSIS — E876 Hypokalemia: Secondary | ICD-10-CM | POA: Diagnosis not present

## 2014-11-19 DIAGNOSIS — F329 Major depressive disorder, single episode, unspecified: Secondary | ICD-10-CM | POA: Diagnosis not present

## 2014-11-19 DIAGNOSIS — K921 Melena: Secondary | ICD-10-CM | POA: Diagnosis not present

## 2014-11-19 DIAGNOSIS — N400 Enlarged prostate without lower urinary tract symptoms: Secondary | ICD-10-CM | POA: Diagnosis not present

## 2014-11-19 DIAGNOSIS — I10 Essential (primary) hypertension: Secondary | ICD-10-CM | POA: Diagnosis not present

## 2014-11-19 DIAGNOSIS — E43 Unspecified severe protein-calorie malnutrition: Secondary | ICD-10-CM | POA: Insufficient documentation

## 2014-11-19 DIAGNOSIS — M1990 Unspecified osteoarthritis, unspecified site: Secondary | ICD-10-CM | POA: Diagnosis not present

## 2014-11-19 DIAGNOSIS — G4700 Insomnia, unspecified: Secondary | ICD-10-CM | POA: Diagnosis not present

## 2014-11-19 DIAGNOSIS — K625 Hemorrhage of anus and rectum: Secondary | ICD-10-CM | POA: Diagnosis not present

## 2014-11-19 DIAGNOSIS — R634 Abnormal weight loss: Secondary | ICD-10-CM | POA: Diagnosis not present

## 2014-11-19 DIAGNOSIS — R1310 Dysphagia, unspecified: Secondary | ICD-10-CM | POA: Diagnosis not present

## 2014-11-19 LAB — CBC
HCT: 34.8 % — ABNORMAL LOW (ref 39.0–52.0)
HCT: 37.7 % — ABNORMAL LOW (ref 39.0–52.0)
HEMOGLOBIN: 13.2 g/dL (ref 13.0–17.0)
Hemoglobin: 12.5 g/dL — ABNORMAL LOW (ref 13.0–17.0)
MCH: 30.9 pg (ref 26.0–34.0)
MCH: 30.8 pg (ref 26.0–34.0)
MCHC: 35.9 g/dL (ref 30.0–36.0)
MCHC: 35 g/dL (ref 30.0–36.0)
MCV: 86.1 fL (ref 78.0–100.0)
MCV: 88.1 fL (ref 78.0–100.0)
PLATELETS: 208 10*3/uL (ref 150–400)
PLATELETS: 195 10*3/uL (ref 150–400)
RBC: 4.04 MIL/uL — ABNORMAL LOW (ref 4.22–5.81)
RBC: 4.28 MIL/uL (ref 4.22–5.81)
RDW: 12.9 % (ref 11.5–15.5)
RDW: 13.1 % (ref 11.5–15.5)
WBC: 6.5 10*3/uL (ref 4.0–10.5)
WBC: 6.1 10*3/uL (ref 4.0–10.5)

## 2014-11-19 MED ORDER — PANTOPRAZOLE SODIUM 40 MG PO TBEC: 40.0000 mg | DELAYED_RELEASE_TABLET | Freq: Two times a day (BID) | ORAL | Status: DC

## 2014-11-19 MED ORDER — DOCUSATE SODIUM 100 MG PO CAPS: 100.0000 mg | ORAL_CAPSULE | Freq: Two times a day (BID) | ORAL | Status: DC

## 2014-11-19 NOTE — Discharge Summary (Signed)
Name: Wesley Reynolds MRN: [id number] DOB: 30-Sep-1931 81 y.o. PCP: Sharilyn Sites, MD  Date of Admission: 11/17/2014  9:12 AM Date of Discharge: 11/19/2014 Attending Physician: Thayer Headings, MD  Discharge Diagnosis: Principal Problem:   Rectal bleeding Active Problems:   Dysphagia   Essential hypertension   BPH (benign prostatic hyperplasia)   Insomnia   Protein-calorie malnutrition, severe  Discharge Medications:   Medication List    STOP taking these medications        ibuprofen 600 MG tablet  Commonly known as:  ADVIL,MOTRIN      TAKE these medications        aspirin EC 81 MG tablet  Take 81 mg by mouth daily.     docusate sodium 100 MG capsule  Commonly known as:  COLACE  Take 1 capsule (100 mg total) by mouth 2 (two) times daily.     multivitamins ther. w/minerals Tabs tablet  Take 1 tablet by mouth daily.     NIFEdipine 60 MG 24 hr tablet  Commonly known as:  PROCARDIA XL/ADALAT-CC  Take 60 mg by mouth 2 (two) times daily.     pantoprazole 40 MG tablet  Commonly known as:  PROTONIX  Take 1 tablet (40 mg total) by mouth 2 (two) times daily.     zolpidem 5 MG tablet  Commonly known as:  AMBIEN  Take 5 mg by mouth at bedtime as needed for sleep.        Disposition and follow-up:   Mr.Detrick D Nie was discharged from [facility] in Stable condition.  At the hospital follow up visit please address:  1.  Rectal Bleeding: Had resolved by the end of hospitalization. Hgb >12 throughout. Has f/u with Dr. Amedeo Plenty. Please assess for any recurrence of bleeding.   Dysphagia: Appears to be intermittent mechanical, To get to get outpatient barium swallow study 3 days after discharge. Has follow up with Dr. Amedeo Plenty (GI).  OA: Stopped ibuprofen.  Weight Loss: may be explained by diet changes with dysphagia, but needs outpatient workup for malignancy.  2.  Labs / imaging needed at time of follow-up: CBC for hemoglobin, BMET for K  3.  Pending labs/  test needing follow-up: none  Follow-up Appointments:     Follow-up Information    Follow up with Purvis Kilts, MD On 11/26/2014.   Specialty:  Family Medicine   Why:  1:45PM   Contact information:   [address] [place] [address] [phone]       Follow up with Missy Sabins, MD.   Specialty:  Gastroenterology   Contact information:   [address] [place] [address] [phone]       Discharge Instructions:  Please do not take your ibuprofen until your GI follow up appointment.  Please get your barium swallow study as an outpatient on 8:30 AM at [facility] in the Radiology Department 1st floor (do not eat or drink or take any of your medications that morning).  Then please attend your GI follow up appointment with Dr. Amedeo Plenty.  [facility] Stay Proper nutrition can help your body recover from illness and injury.   Foods and beverages high in protein, vitamins, and minerals help rebuild muscle loss, promote healing, & reduce fall risk.   .In addition to eating healthy foods, a nutrition shake is an easy, delicious way to get the nutrition you need during and after your hospital stay  It is recommended that you  continue to drink 2-3 bottles per day of:       Boost Breeze for at least 1 month (30 days) after your hospital stay   Tips for adding a nutrition shake into your routine: As allowed, drink one with vitamins or medications instead of water or juice Enjoy one as a tasty mid-morning or afternoon snack Drink cold or make a smoothie/milkshake out of it Drink one instead of milk with cereal or snacks Mix in with ginger ale   Available at the following grocery stores and pharmacies:           * [facility] [place] [phone]            For COUPONS visit:  [email or web address] or [email or web address]   Suggested Substitutions Ensure Active Clear = Boost Breeze   Consultations: Treatment Team:  Missy Sabins, MD  Procedures Performed:  Dg Chest 2 View  11/17/2014   CLINICAL DATA:  Dysphagia with intermittent coughing when eating solid foods. Blood per rectum today. Initial encounter.  EXAM: CHEST  2 VIEW  COMPARISON:  03/15/2009.  FINDINGS: The heart size and mediastinal contours are stable. There is stable mild elevation of the right hemidiaphragm. There is a stable small calcified granuloma at the right lung apex. The lungs are otherwise clear. There is no pleural effusion or pneumothorax. The bones appear unremarkable.  IMPRESSION: Stable chest.  No active cardiopulmonary process.   Electronically Signed   By: Camie Patience M.D.   On: 11/17/2014 16:14    Admission HPI: Mr. Wesley Reynolds is an 81 yo man with a history of hypertension, OA and BPH who presented to the [facility] ED after an episode of dark red blood with a BM this morning. He had noticed darker stools than usual over the past 2 weeks, but this was the first episode during which he saw blood. He describes that the blood filled the toilet bowl, but admits that it was hard to estimate the amount. He has had some diffuse abdominal soreness, which he attributed to his exercise routine, and has had decreased energy, dizziness on standing and a 15 pound weight loss over the past several months. Of note, he has been taking 2400 mg ibuprofen daily for his osteoarthritic pain for 12 of the past 14 days. He has also noted increased constipation, particularly several weeks ago when he was drinking Boost shakes (which he has since stopped drinking).  Of note, his last colonoscopy was in 2014 with Dr. Laural Golden during which he was found to have sigmoid diverticula, colonic adenomas and hemorrhoids. Prior to that colonoscopy, he had one in 2009 (during which 4 small polyps were removed). He has no  family history of colorectal carcinoma. He has never been a smoker.   Hospital Course by problem list: Principal Problem:   Rectal bleeding Active Problems:   Dysphagia   Essential hypertension   BPH (benign prostatic hyperplasia)   Insomnia   Protein-calorie malnutrition, severe   [name] is an 81 yo man who presented with rectal bleeding with bowel movements in the context of a 2 week history of taking 2400 mg ibuprofen daily. His hemoglobin remained within normal limits, and his symptoms of dizziness  improved. He has also been experiencing some dysphagia with solid foods (meat) and a 15 lb weight loss.   Rectal Bleeding: Patient presented with an episode of dark blood with a BM. Since stopping his ibuprofen, his BMs are lighter in color and he has not had blood in the toilet bowel for 24 hours. His last colonoscopy was in 2014 with Dr. Laural Golden during which he was found to have sigmoid diverticula, colonic adenomas and hemorrhoids. Prior to that colonoscopy, he had one in 2009 (during which 4 small polyps were removed). He has no family history of colorectal carcinoma. He was a smoker for several years in his 18s. He has chronic constipation for which he takes prune juice and miralax. No external hemorrhoids on exam, but gross blood on ED rectal exam with FOBT +. Hgb 15.6-->13.2 (down from 18 2 months ago). He does report decreased energy and some dizziness on standing. Dark red bleeding could be from upper GI bleed (patient also reports dysphagia, see below), diverticular bleed or hemorrhoids. Patient was to continue bowel regimen and stop ibuprofen.   Dysphagia: Patient reports intermittent difficulty swallowing, with food "sticking" in his throat at times (intermittent mechanical obstruction). He does not have difficulty with liquids. This is suggestive of lower esophageal ring, though coupled with his recent weight loss, cancer should be considered as well. Either way, an upper GI bleed  could be the cause of his darkened stool and even the dark blood with his BM today. CXR to evaluate for aspiration was negative.Barium study scheduled as outpatient for 3 days after discharge. He also has follow up with Dr. Amedeo Plenty.   Weight Loss: Estimates a 15 lb weight loss over the past several months, weight has now stabilized. Was previously taking Boost, but thought the drinks were causing constipation, so he stopped. Patient admits he has been eating less meat and more soup since dysphagia was present. Needs outpatient workup.  Hypokalemia: 3.2-->3.6 with supplementation. Baseline 4.5 (2 months ago).  Osteoarthritis: Patient reports pain in his joints. Recently prescribed 800 mg TID and has taken it (with a 2 day hiatus for refill) over the past 2 weeks. Stopped ibuprofen  Hypertension: well controlled on home nifedipine 60 mg by mouth BID  Depressed Mood and Insomnia: Patient and patient's wife are concerned that he may be depressed, particularly before he takes his Ambien. He also has had a difficult time falling asleep. (On Ambien 5 mg by mouth at bedtime PRN)  BPH: Patient reports frequent urination. Follows with urology.  Discharge Vitals:   BP 104/66 mmHg  Pulse 68  Temp(Src) 97.8 F (36.6 C) (Oral)  Resp 16  Ht 5\' 9"  (1.753 m)  Wt 153 lb 6.4 oz (69.582 kg)  BMI 22.64 kg/m2  SpO2 100%  Discharge Labs:  Results for orders placed or performed during the hospital encounter of 11/17/14 (from the past 24 hour(s))  CBC     Status: None   Collection Time: 11/18/14  4:15 PM  Result Value Ref Range   WBC 6.3 4.0 - 10.5 K/uL   RBC 4.46 4.22 - 5.81 MIL/uL   Hemoglobin 13.7 13.0 - 17.0 g/dL   HCT 39.6 39.0 - 52.0 %   MCV 88.8 78.0 - 100.0 fL   MCH 30.7 26.0 - 34.0 pg   MCHC 34.6 30.0 - 36.0 g/dL   RDW 13.1 11.5 - 15.5 %   Platelets 212 150 - 400 K/uL  CBC     Status: Abnormal   Collection Time:  11/19/14  1:26 AM  Result Value Ref Range   WBC 6.5 4.0 - 10.5  K/uL   RBC 4.04 (L) 4.22 - 5.81 MIL/uL   Hemoglobin 12.5 (L) 13.0 - 17.0 g/dL   HCT 34.8 (L) 39.0 - 52.0 %   MCV 86.1 78.0 - 100.0 fL   MCH 30.9 26.0 - 34.0 pg   MCHC 35.9 30.0 - 36.0 g/dL   RDW 12.9 11.5 - 15.5 %   Platelets 208 150 - 400 K/uL  CBC     Status: Abnormal   Collection Time: 11/19/14  8:38 AM  Result Value Ref Range   WBC 6.1 4.0 - 10.5 K/uL   RBC 4.28 4.22 - 5.81 MIL/uL   Hemoglobin 13.2 13.0 - 17.0 g/dL   HCT 37.7 (L) 39.0 - 52.0 %   MCV 88.1 78.0 - 100.0 fL   MCH 30.8 26.0 - 34.0 pg   MCHC 35.0 30.0 - 36.0 g/dL   RDW 13.1 11.5 - 15.5 %   Platelets 195 150 - 400 K/uL    Signed: Drucilla Schmidt, MD 11/19/2014, 3:59 PM    Services Ordered on Discharge: none Equipment Ordered on Discharge: none

## 2014-11-19 NOTE — Progress Notes (Signed)
Subjective: Mr. Gang feels well today. He had one BM this morning. It was lighter in color than his BMs have been and there was no blood in the toilet. Last night, his BM was dark, but there was no blood in the toilet. Yesterday morning, he had a BM with blood. Asked more questions about his dysphagia, Mr. Crisostomo believes his difficulty swallowing has been intermittent.  Objective: Vital signs in last 24 hours: Filed Vitals:   11/18/14 0555 11/18/14 1357 11/18/14 2200 11/19/14 0525  BP: 119/70 143/72 139/76 112/56  Pulse: 70 91 77 75  Temp: 98.6 F (37 C) 97.4 F (36.3 C) 97.5 F (36.4 C) 98.5 F (36.9 C)  TempSrc: Oral Oral Oral Oral  Resp: 17 16 16 16   Height:      Weight:      SpO2: 99% 100% 100% 100%   Weight change:   Intake/Output Summary (Last 24 hours) at 11/19/14 1311 Last data filed at 11/19/14 0914  Gross per 24 hour  Intake    720 ml  Output    700 ml  Net     20 ml   Physical Exam: Appearance: in NAD, wife at bedside HEENT: AT/[place], PERRL, EOMi, no lymphadenopathy, SKs on forehead and diffusely on face, MMM Heart: RRR, normal S1S2, no MRG Lungs: CTAB, no wheezes Abdomen: BS+, soft, diffusely slightly tender, no organomegaly, 7" vertical scar (s/p cholescysectomy and appendectomy) with nonpainful, reducible hernia Extremities: normal range of motion with no edema Neurologic: A&Ox3, some confusion, but generally well-adjusted, grossly intact Skin: Large SKs diffusely  Lab Results: Basic Metabolic Panel:  Recent Labs Lab 11/17/14 0923 11/18/14 0326  NA 138 140  K 3.2* 3.6  CL 101 108  CO2 27 25  GLUCOSE 148* 113*  BUN 19 13  CREATININE 0.88 0.74  CALCIUM 9.7 8.7   Liver Function Tests:  Recent Labs Lab 11/17/14 0923  AST 28  ALT 26  ALKPHOS 89  BILITOT 0.7  PROT 7.7  ALBUMIN 4.6   CBC:  Recent Labs Lab 11/17/14 0923  11/19/14 0126 11/19/14 0838  WBC 7.7  < > 6.5 6.1  NEUTROABS 5.8  --   --   --   HGB 15.6  < > 12.5* 13.2  HCT  43.5  < > 34.8* 37.7*  MCV 86.0  < > 86.1 88.1  PLT 248  < > 208 195  < > = values in this interval not displayed. Urinalysis:  Recent Labs Lab 11/17/14 1018  COLORURINE YELLOW  LABSPEC 1.017  PHURINE 5.5  GLUCOSEU 100*  HGBUR NEGATIVE  BILIRUBINUR NEGATIVE  KETONESUR NEGATIVE  PROTEINUR NEGATIVE  UROBILINOGEN 0.2  NITRITE NEGATIVE  LEUKOCYTESUR NEGATIVE   Studies/Results: Dg Chest 2 View  11/17/2014   CLINICAL DATA:  Dysphagia with intermittent coughing when eating solid foods. Blood per rectum today. Initial encounter.  EXAM: CHEST  2 VIEW  COMPARISON:  03/15/2009.  FINDINGS: The heart size and mediastinal contours are stable. There is stable mild elevation of the right hemidiaphragm. There is a stable small calcified granuloma at the right lung apex. The lungs are otherwise clear. There is no pleural effusion or pneumothorax. The bones appear unremarkable.  IMPRESSION: Stable chest.  No active cardiopulmonary process.   Electronically Signed   By: Camie Patience M.D.   On: 11/17/2014 16:14   Medications: I have reviewed the patient's current medications. Scheduled Meds: . docusate sodium  100 mg Oral BID  . feeding supplement (RESOURCE BREEZE)  1 Container  Oral TID PC  . multivitamin with minerals  1 tablet Oral Daily  . NIFEdipine  60 mg Oral BID  . pantoprazole  40 mg Oral BID  . polyethylene glycol  17 g Oral Daily  . senna  1 tablet Oral BID  . sodium chloride  3 mL Intravenous Q12H   Continuous Infusions:  PRN Meds:.zolpidem Assessment/Plan: Principal Problem:   Rectal bleeding Active Problems:   Dysphagia   Essential hypertension   BPH (benign prostatic hyperplasia)   Insomnia   Protein-calorie malnutrition, severe Mr. Corleone Biegler is an 81 yo man who presented with rectal bleeding with bowel movements in the context of a 2 week history of taking 2400 mg ibuprofen daily. His hemoglobin has remained within normal limits, and his symptoms of dizziness have improved,  but his hemoglobin is significantly lower now than it was 2 months ago (when it was elevated at 18). He has also been experiencing some dysphagia with solid foods (meat) and weight loss.   Rectal Bleeding: Patient presented with an episode of dark blood with a BM. Since stopping his ibuprofen, his BMs are lighter in color and he has not had blood in the toilet bowel for 24 hours. His last colonoscopy was in 2014 with Dr. Laural Golden during which he was found to have sigmoid diverticula, colonic adenomas and hemorrhoids. Prior to that colonoscopy, he had one in 2009 (during which 4 small polyps were removed). He has no family history of colorectal carcinoma. He was a smoker for several years in his 84s. He has chronic constipation for which he takes prune juice and miralax. No external hemorrhoids on exam, but gross blood on ED rectal exam with FOBT +. Hgb 15.6-->13.2 (down from 18 2 months ago). He does report decreased energy and some dizziness on standing. Dark red bleeding could be from upper GI bleed (patient reports dysphagia, see below), diverticular bleed or hemorrhoids.  - Appreciate GI consult - Trend CBCs q8 hours  - Hold home ibuprofen (800 mg by mouth TID) - Miralax, Senokot and Colace for bowel regimen  Dysphagia: Patient reports intermittent difficulty swallowing, with food "sticking" in his throat at times (intermittent mechanical obstruction). He does not have difficulty with liquids. This is suggestive of lower esophageal ring, though coupled with his recent weight loss, cancer should be considered as well. Either way, an upper GI bleed could be the cause of his darkened stool and even the dark blood with his BM today. CXR to evaluate for aspiration was negative.                - Appreciate GI consult - Clear liquid diet - Barium swallow today to evaluate for Zenker's diverticulum, esophageal stricture or ring  Weight Loss: Estimates a 15 lb weight loss over the past several months, weight  has now stabilized. Was previously taking Boost, but thought the drinks were causing constipation, so he stopped. Patient admits he has been eating less meat and more soup since dysphagia was present. - Continue to monitor as cause of bleed is investigated  Hypokalemia: Currently 3.2-->3.6. Baseline 4.5 (2 months ago). - Kdur supplementation - Trend BMET  Osteoarthritis: Patient reports pain in his joints. Recently prescribed 800 mg TID and has taken it (with a 2 day hiatus for refill) over the past 2 weeks. - Hold ibuprofen  Hypertension: currently 104/66 (highest 143/73 over past 24 hours). - Continue home nifedipine 60 mg by mouth BID  Depressed Mood and Insomnia: Patient and patient's wife are  concerned that he may be depressed, particularly before he takes his Ambien. He also has had a difficult time falling asleep. - Continue home Ambien 5 mg by mouth at bedtime PRN  BPH: Patient reports frequent urination. - Continue to monitor - Follows with urology  Diet: Clear liquid diet --> ADAT  DVT Ppx: SCDs in context of bleeding  Dispo: Disposition is deferred at this time, awaiting improvement of current medical problems.  Anticipated discharge in approximately 0-1 day(s).   The patient does have a current PCP Sharilyn Sites, MD) and does not need an [facility] hospital follow-up appointment after discharge.  The patient does not know have transportation limitations that hinder transportation to clinic appointments.  .Services Needed at time of discharge: Y = Yes, Blank = No PT:   OT:   RN:   Equipment:   Other:     LOS: 2 days   Drucilla Schmidt, MD 11/19/2014, 1:11 PM

## 2014-11-19 NOTE — Progress Notes (Signed)
UR completed 

## 2014-11-19 NOTE — Discharge Instructions (Signed)
Please do not take your ibuprofen until your GI follow up appointment.  Please get your barium swallow study as an outpatient on 8:30 AM at [facility] in the Radiology Department 1st floor (do not eat or drink or take any of your medications that morning).  Then please attend your GI follow up appointment with Dr. Amedeo Plenty.  [facility] Stay Proper nutrition can help your body recover from illness and injury.   Foods and beverages high in protein, vitamins, and minerals help rebuild muscle loss, promote healing, & reduce fall risk.   In addition to eating healthy foods, a nutrition shake is an easy, delicious way to get the nutrition you need during and after your hospital stay  It is recommended that you continue to drink 2-3 bottles per day of:       Boost Breeze for at least 1 month (30 days) after your hospital stay   Tips for adding a nutrition shake into your routine: As allowed, drink one with vitamins or medications instead of water or juice Enjoy one as a tasty mid-morning or afternoon snack Drink cold or make a smoothie/milkshake out of it Drink one instead of milk with cereal or snacks Mix in with ginger ale   Available at the following grocery stores and pharmacies:           * [place] [phone]            For COUPONS visit: [email or web address]/join or [email or web address]   Suggested Substitutions Ensure Active Clear = Boost Breeze

## 2014-11-19 NOTE — Progress Notes (Signed)
D/c with wife to home via wheelchair.  Breathing regular and unlabored on room air. VSS.  Instructions given and demonstrated understanding.

## 2014-11-19 NOTE — Progress Notes (Signed)
[facility] Progress Note  Subjective: 1 small bowel movement today with more formed some blood but less than previous  Objective: Vital signs in last 24 hours: Temp:  [97.4 F (36.3 C)-98.5 F (36.9 C)] 98.5 F (36.9 C) (02/05 0525) Pulse Rate:  [75-91] 75 (02/05 0525) Resp:  [16] 16 (02/05 0525) BP: (112-143)/(56-76) 112/56 mmHg (02/05 0525) SpO2:  [100 %] 100 % (02/05 0525) Weight change:    PE: Abdomen soft  Lab Results: Results for orders placed or performed during the hospital encounter of 11/17/14 (from the past 24 hour(s))  CBC     Status: None   Collection Time: 11/18/14  4:15 PM  Result Value Ref Range   WBC 6.3 4.0 - 10.5 K/uL   RBC 4.46 4.22 - 5.81 MIL/uL   Hemoglobin 13.7 13.0 - 17.0 g/dL   HCT 39.6 39.0 - 52.0 %   MCV 88.8 78.0 - 100.0 fL   MCH 30.7 26.0 - 34.0 pg   MCHC 34.6 30.0 - 36.0 g/dL   RDW 13.1 11.5 - 15.5 %   Platelets 212 150 - 400 K/uL  CBC     Status: Abnormal   Collection Time: 11/19/14  1:26 AM  Result Value Ref Range   WBC 6.5 4.0 - 10.5 K/uL   RBC 4.04 (L) 4.22 - 5.81 MIL/uL   Hemoglobin 12.5 (L) 13.0 - 17.0 g/dL   HCT 34.8 (L) 39.0 - 52.0 %   MCV 86.1 78.0 - 100.0 fL   MCH 30.9 26.0 - 34.0 pg   MCHC 35.9 30.0 - 36.0 g/dL   RDW 12.9 11.5 - 15.5 %   Platelets 208 150 - 400 K/uL  CBC     Status: Abnormal   Collection Time: 11/19/14  8:38 AM  Result Value Ref Range   WBC 6.1 4.0 - 10.5 K/uL   RBC 4.28 4.22 - 5.81 MIL/uL   Hemoglobin 13.2 13.0 - 17.0 g/dL   HCT 37.7 (L) 39.0 - 52.0 %   MCV 88.1 78.0 - 100.0 fL   MCH 30.8 26.0 - 34.0 pg   MCHC 35.0 30.0 - 36.0 g/dL   RDW 13.1 11.5 - 15.5 %   Platelets 195 150 - 400 K/uL    Studies/Results: Dg Chest 2 View  11/17/2014   CLINICAL DATA:  Dysphagia with intermittent coughing when eating solid foods. Blood per rectum today. Initial encounter.  EXAM: CHEST  2 VIEW  COMPARISON:  03/15/2009.  FINDINGS: The heart size and mediastinal contours are stable. There is stable mild  elevation of the right hemidiaphragm. There is a stable small calcified granuloma at the right lung apex. The lungs are otherwise clear. There is no pleural effusion or pneumothorax. The bones appear unremarkable.  IMPRESSION: Stable chest.  No active cardiopulmonary process.   Electronically Signed   By: Camie Patience M.D.   On: 11/17/2014 16:14      Assessment: 1. GI bleed, suspect diverticular and appears to be self-limiting at this point 2. Dysphagia for solids and pills  Plan: Will continue to monitor stools and hemoglobin and hold off on colonoscopy given fairly recent studies showing diverticulosis Obtain barium swallow with tablet to assess for Zenker's diverticulum or esophageal stricture or ring.      Michale Emmerich C 11/19/2014, 10:10 AM

## 2014-11-22 ENCOUNTER — Other Ambulatory Visit (HOSPITAL_COMMUNITY): Payer: Self-pay | Admitting: Gastroenterology

## 2014-11-22 ENCOUNTER — Ambulatory Visit (HOSPITAL_COMMUNITY)
Admission: RE | Admit: 2014-11-22 | Discharge: 2014-11-22 | Disposition: A | Discharge status: Home / Self Care | Payer: Medicare Other | Source: Ambulatory Visit | Attending: Gastroenterology | Admitting: Gastroenterology

## 2014-11-22 ENCOUNTER — Ambulatory Visit (HOSPITAL_COMMUNITY): Admission: EM | Admit: 2014-11-22 | Payer: Medicare Other | Source: Home / Self Care | Admitting: Internal Medicine

## 2014-11-22 DIAGNOSIS — R1310 Dysphagia, unspecified: Secondary | ICD-10-CM | POA: Diagnosis not present

## 2014-11-22 DIAGNOSIS — K219 Gastro-esophageal reflux disease without esophagitis: Secondary | ICD-10-CM | POA: Insufficient documentation

## 2014-11-26 DIAGNOSIS — Z6823 Body mass index (BMI) 23.0-23.9, adult: Secondary | ICD-10-CM | POA: Diagnosis not present

## 2014-11-26 DIAGNOSIS — T39395S Adverse effect of other nonsteroidal anti-inflammatory drugs [NSAID], sequela: Secondary | ICD-10-CM | POA: Diagnosis not present

## 2014-11-26 DIAGNOSIS — K922 Gastrointestinal hemorrhage, unspecified: Secondary | ICD-10-CM | POA: Diagnosis not present

## 2014-12-03 DIAGNOSIS — K625 Hemorrhage of anus and rectum: Secondary | ICD-10-CM | POA: Diagnosis not present

## 2014-12-03 DIAGNOSIS — R1314 Dysphagia, pharyngoesophageal phase: Secondary | ICD-10-CM | POA: Diagnosis not present

## 2014-12-16 DIAGNOSIS — R634 Abnormal weight loss: Secondary | ICD-10-CM | POA: Diagnosis not present

## 2014-12-16 DIAGNOSIS — Z6823 Body mass index (BMI) 23.0-23.9, adult: Secondary | ICD-10-CM | POA: Diagnosis not present

## 2014-12-16 DIAGNOSIS — G4700 Insomnia, unspecified: Secondary | ICD-10-CM | POA: Diagnosis not present

## 2015-02-22 DIAGNOSIS — Z6824 Body mass index (BMI) 24.0-24.9, adult: Secondary | ICD-10-CM | POA: Diagnosis not present

## 2015-02-22 DIAGNOSIS — I10 Essential (primary) hypertension: Secondary | ICD-10-CM | POA: Diagnosis not present

## 2015-02-22 DIAGNOSIS — L03116 Cellulitis of left lower limb: Secondary | ICD-10-CM | POA: Diagnosis not present

## 2015-02-22 DIAGNOSIS — N400 Enlarged prostate without lower urinary tract symptoms: Secondary | ICD-10-CM | POA: Diagnosis not present

## 2015-06-03 DIAGNOSIS — E782 Mixed hyperlipidemia: Secondary | ICD-10-CM | POA: Diagnosis not present

## 2015-06-03 DIAGNOSIS — R7309 Other abnormal glucose: Secondary | ICD-10-CM | POA: Diagnosis not present

## 2015-06-03 DIAGNOSIS — Z6825 Body mass index (BMI) 25.0-25.9, adult: Secondary | ICD-10-CM | POA: Diagnosis not present

## 2015-06-03 DIAGNOSIS — Z0001 Encounter for general adult medical examination with abnormal findings: Secondary | ICD-10-CM | POA: Diagnosis not present

## 2015-06-03 DIAGNOSIS — E663 Overweight: Secondary | ICD-10-CM | POA: Diagnosis not present

## 2015-06-03 DIAGNOSIS — Z1389 Encounter for screening for other disorder: Secondary | ICD-10-CM | POA: Diagnosis not present

## 2015-06-03 DIAGNOSIS — I10 Essential (primary) hypertension: Secondary | ICD-10-CM | POA: Diagnosis not present

## 2015-06-14 DIAGNOSIS — R7309 Other abnormal glucose: Secondary | ICD-10-CM | POA: Diagnosis not present

## 2015-06-14 DIAGNOSIS — I10 Essential (primary) hypertension: Secondary | ICD-10-CM | POA: Diagnosis not present

## 2015-06-14 DIAGNOSIS — Z1389 Encounter for screening for other disorder: Secondary | ICD-10-CM | POA: Diagnosis not present

## 2015-06-14 DIAGNOSIS — Z0001 Encounter for general adult medical examination with abnormal findings: Secondary | ICD-10-CM | POA: Diagnosis not present

## 2015-07-01 ENCOUNTER — Ambulatory Visit (INDEPENDENT_AMBULATORY_CARE_PROVIDER_SITE_OTHER): Payer: Medicare Other | Admitting: Urology

## 2015-07-01 DIAGNOSIS — N400 Enlarged prostate without lower urinary tract symptoms: Secondary | ICD-10-CM

## 2015-07-01 DIAGNOSIS — R81 Glycosuria: Secondary | ICD-10-CM | POA: Diagnosis not present

## 2015-07-01 DIAGNOSIS — N4281 Prostatodynia syndrome: Secondary | ICD-10-CM | POA: Diagnosis not present

## 2015-07-01 DIAGNOSIS — R972 Elevated prostate specific antigen [PSA]: Secondary | ICD-10-CM

## 2015-07-11 DIAGNOSIS — D2271 Melanocytic nevi of right lower limb, including hip: Secondary | ICD-10-CM | POA: Diagnosis not present

## 2015-07-11 DIAGNOSIS — L820 Inflamed seborrheic keratosis: Secondary | ICD-10-CM | POA: Diagnosis not present

## 2015-07-11 DIAGNOSIS — L821 Other seborrheic keratosis: Secondary | ICD-10-CM | POA: Diagnosis not present

## 2015-07-11 DIAGNOSIS — X32XXXD Exposure to sunlight, subsequent encounter: Secondary | ICD-10-CM | POA: Diagnosis not present

## 2015-07-11 DIAGNOSIS — D485 Neoplasm of uncertain behavior of skin: Secondary | ICD-10-CM | POA: Diagnosis not present

## 2015-07-11 DIAGNOSIS — L570 Actinic keratosis: Secondary | ICD-10-CM | POA: Diagnosis not present

## 2015-07-11 DIAGNOSIS — Z1283 Encounter for screening for malignant neoplasm of skin: Secondary | ICD-10-CM | POA: Diagnosis not present

## 2015-07-12 ENCOUNTER — Other Ambulatory Visit: Payer: Self-pay | Admitting: Urology

## 2015-07-12 DIAGNOSIS — R972 Elevated prostate specific antigen [PSA]: Secondary | ICD-10-CM

## 2015-07-20 DIAGNOSIS — H524 Presbyopia: Secondary | ICD-10-CM | POA: Diagnosis not present

## 2015-07-20 DIAGNOSIS — H52223 Regular astigmatism, bilateral: Secondary | ICD-10-CM | POA: Diagnosis not present

## 2015-07-20 DIAGNOSIS — H5203 Hypermetropia, bilateral: Secondary | ICD-10-CM | POA: Diagnosis not present

## 2015-07-20 DIAGNOSIS — H25819 Combined forms of age-related cataract, unspecified eye: Secondary | ICD-10-CM | POA: Diagnosis not present

## 2015-08-01 ENCOUNTER — Other Ambulatory Visit: Payer: Self-pay | Admitting: Urology

## 2015-08-01 DIAGNOSIS — R972 Elevated prostate specific antigen [PSA]: Secondary | ICD-10-CM

## 2015-08-05 ENCOUNTER — Other Ambulatory Visit (HOSPITAL_COMMUNITY): Payer: Medicare Other

## 2015-08-08 DIAGNOSIS — Z23 Encounter for immunization: Secondary | ICD-10-CM | POA: Diagnosis not present

## 2015-08-19 ENCOUNTER — Ambulatory Visit (HOSPITAL_COMMUNITY)
Admission: RE | Admit: 2015-08-19 | Discharge: 2015-08-19 | Disposition: A | Discharge status: Home / Self Care | Payer: Medicare Other | Source: Ambulatory Visit | Attending: Urology | Admitting: Urology

## 2015-08-19 ENCOUNTER — Encounter (HOSPITAL_COMMUNITY): Payer: Self-pay

## 2015-08-19 DIAGNOSIS — R972 Elevated prostate specific antigen [PSA]: Secondary | ICD-10-CM | POA: Diagnosis not present

## 2015-08-19 MED ORDER — LIDOCAINE HCL (PF) 2 % IJ SOLN
INTRAMUSCULAR | Status: AC
  Administered 2015-08-19: 10 mL
  Filled 2015-08-19: qty 10

## 2015-08-19 MED ORDER — LIDOCAINE HCL (PF) 1 % IJ SOLN
INTRAMUSCULAR | Status: AC
  Administered 2015-08-19: 2.1 mL
  Filled 2015-08-19: qty 5

## 2015-08-19 MED ORDER — CEFTRIAXONE SODIUM 1 G IJ SOLR
INTRAMUSCULAR | Status: AC
Start: 2015-08-19 — End: 2015-08-19
  Administered 2015-08-19: 1000 mg via INTRAMUSCULAR
  Filled 2015-08-19: qty 10

## 2015-08-19 NOTE — Discharge Instructions (Signed)
Transrectal Ultrasound-Guided Biopsy °A transrectal ultrasound-guided biopsy is a procedure to take samples of tissue from your prostate. Ultrasound images are used to guide the procedure. It is usually done to check the prostate gland for cancer. °BEFORE THE PROCEDURE °· Do not eat or drink after midnight on the night before your procedure. °· Take medicines as your doctor tells you. °· Your doctor may have you stop taking some medicines 5-7 days before the procedure. °· You will be given an enema before your procedure. During an enema, a liquid is put into your butt (rectum) to clear out waste. °· You may have lab tests the day of your procedure. °· Make plans to have someone drive you home. °PROCEDURE °· You will be given medicine to help you relax before the procedure. An IV tube will be put into one of your veins. It will be used to give fluids and medicine. °· You will be given medicine to reduce the risk of infection (antibiotic). °· You will be placed on your side. °· A probe with gel will be put in your butt. This is used to take pictures of your prostate and the area around it. °· A medicine to numb the area is put into your prostate. °· A biopsy needle is then inserted and guided to your prostate. °· Samples of prostate tissue are taken. The needle is removed. °· The samples are sent to a lab to be checked. Results are usually back in 2-3 days. °AFTER THE PROCEDURE °· You will be taken to a room where you will be watched until you are doing okay. °· You may have some pain in the area around your butt. You will be given medicines for this. °· You may be able to go home the same day. Sometimes, an overnight stay in the hospital is needed. °  °This information is not intended to replace advice given to you by your health care provider. Make sure you discuss any questions you have with your health care provider. °  °Document Released: 09/19/2009 Document Revised: 10/06/2013 Document Reviewed:  05/20/2013 °Elsevier Interactive Patient Education ©2016 [employer] ° °

## 2015-11-15 DIAGNOSIS — Z1389 Encounter for screening for other disorder: Secondary | ICD-10-CM | POA: Diagnosis not present

## 2015-11-15 DIAGNOSIS — I10 Essential (primary) hypertension: Secondary | ICD-10-CM | POA: Diagnosis not present

## 2015-11-15 DIAGNOSIS — E663 Overweight: Secondary | ICD-10-CM | POA: Diagnosis not present

## 2015-11-15 DIAGNOSIS — Z6826 Body mass index (BMI) 26.0-26.9, adult: Secondary | ICD-10-CM | POA: Diagnosis not present

## 2015-12-06 DIAGNOSIS — R972 Elevated prostate specific antigen [PSA]: Secondary | ICD-10-CM | POA: Diagnosis not present

## 2015-12-09 ENCOUNTER — Ambulatory Visit (INDEPENDENT_AMBULATORY_CARE_PROVIDER_SITE_OTHER): Payer: Medicare Other | Admitting: Urology

## 2015-12-09 DIAGNOSIS — R972 Elevated prostate specific antigen [PSA]: Secondary | ICD-10-CM | POA: Diagnosis not present

## 2015-12-09 DIAGNOSIS — N400 Enlarged prostate without lower urinary tract symptoms: Secondary | ICD-10-CM | POA: Diagnosis not present

## 2015-12-27 DIAGNOSIS — L03032 Cellulitis of left toe: Secondary | ICD-10-CM | POA: Diagnosis not present

## 2015-12-27 DIAGNOSIS — L600 Ingrowing nail: Secondary | ICD-10-CM | POA: Diagnosis not present

## 2015-12-27 DIAGNOSIS — B351 Tinea unguium: Secondary | ICD-10-CM | POA: Diagnosis not present

## 2015-12-27 DIAGNOSIS — M79672 Pain in left foot: Secondary | ICD-10-CM | POA: Diagnosis not present

## 2016-02-09 IMAGING — US US TRANSRECTAL
1 series · 14 of 16 positions shown · non-contrast
Comparison: none

[Series 1: us transrectal · 0.11mm/px · 14 of 21 slices shown]
[im 1/21]
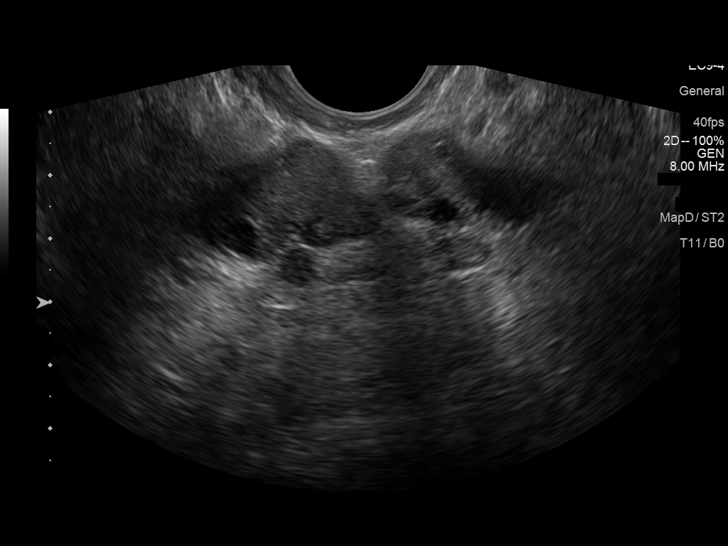
[im 2/21]
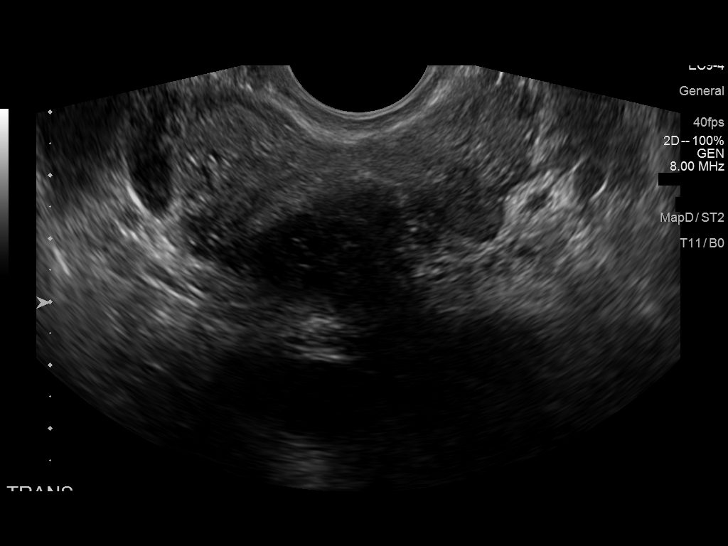
[im 3/21]
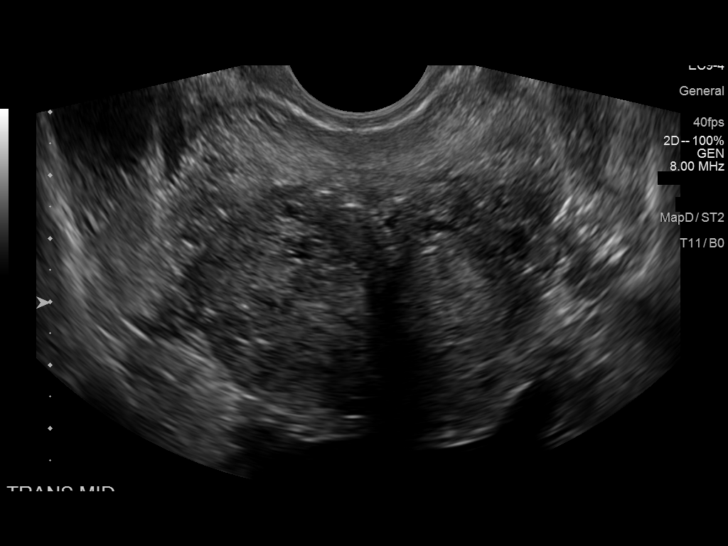
[im 6/21]
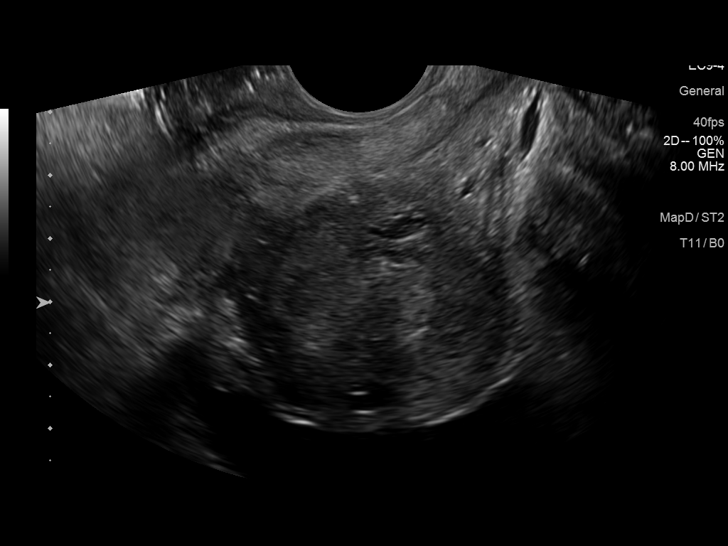
[im 7/21]
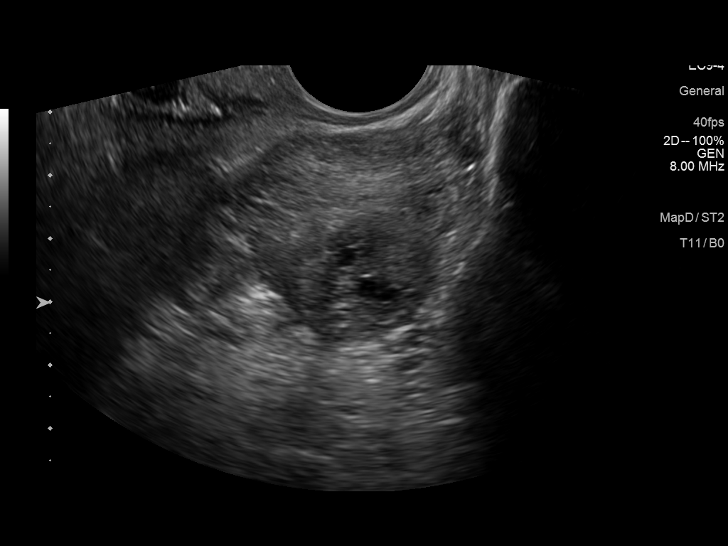
[im 9/21]
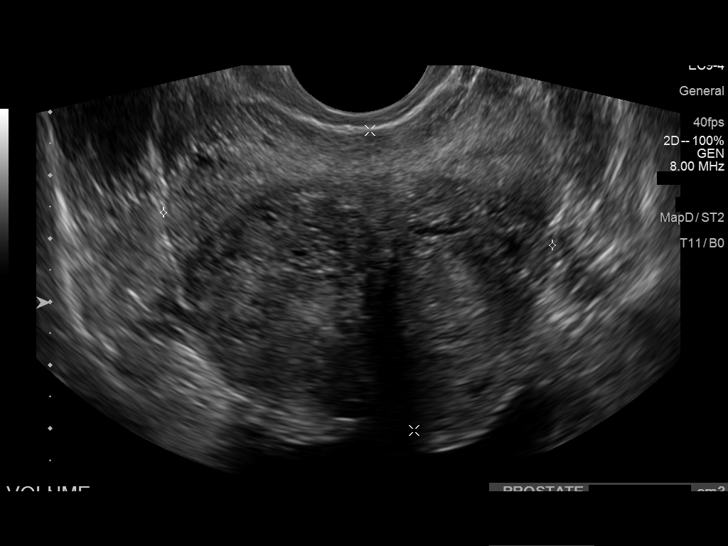
[im 10/21]
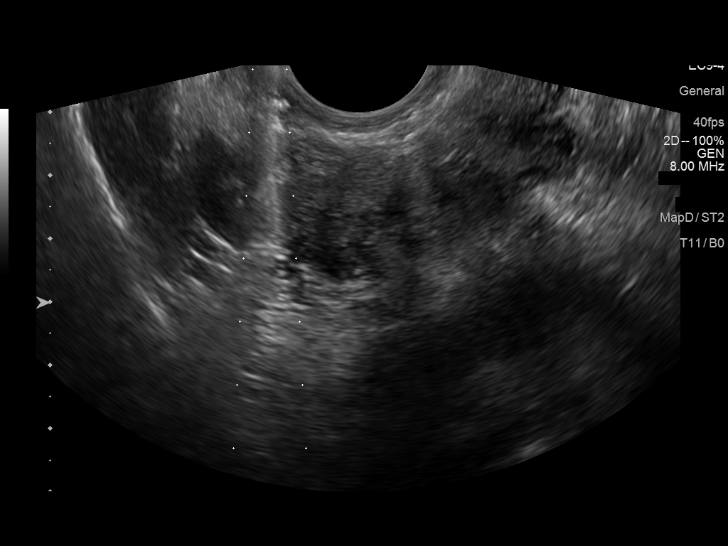
[im 11/21]
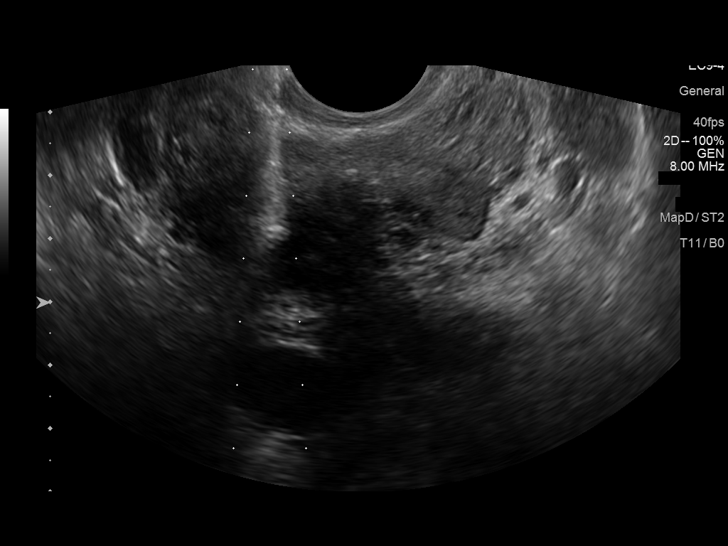
[im 13/21]
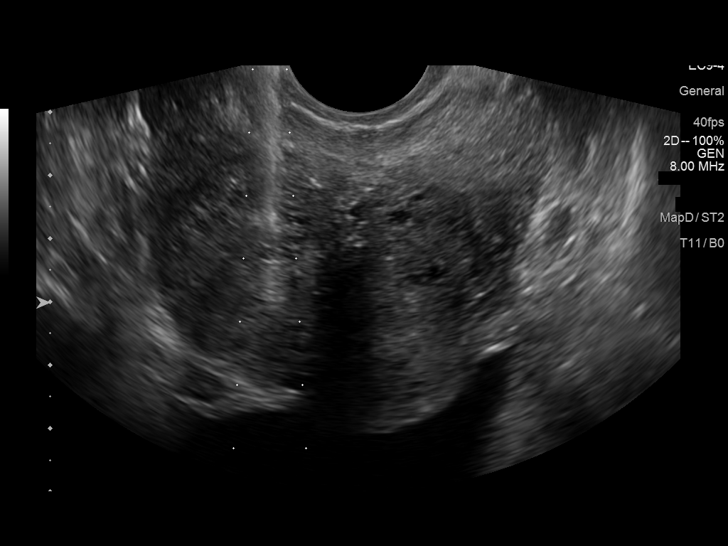
[im 14/21]
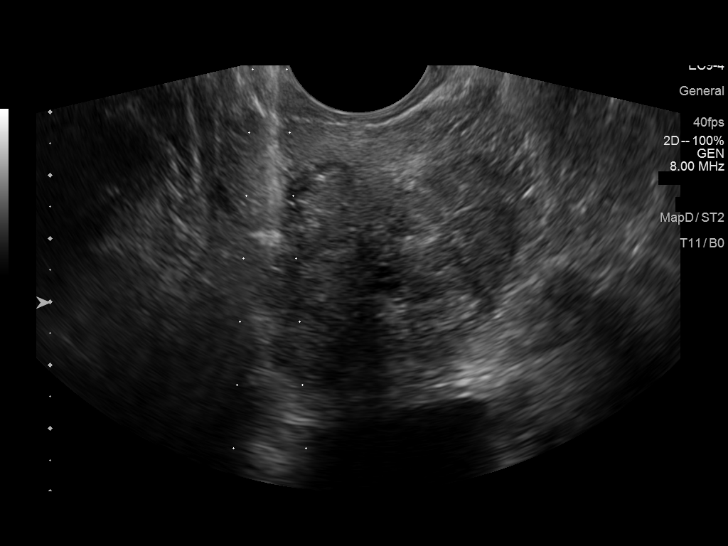
[im 17/21]
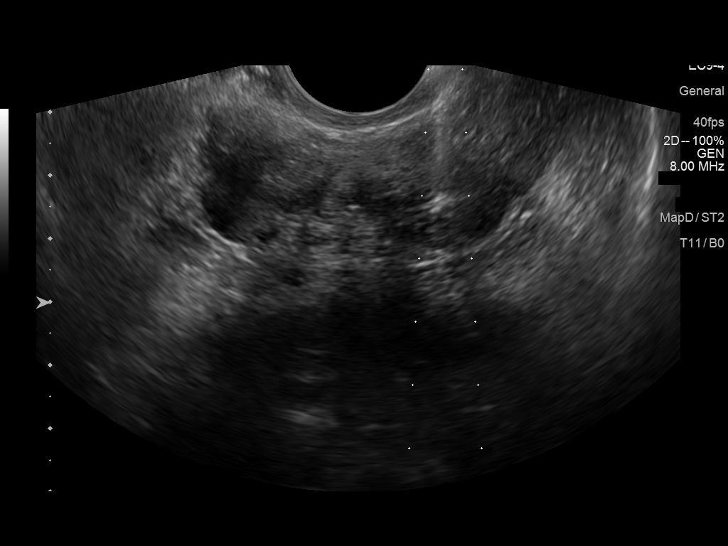
[im 18/21]
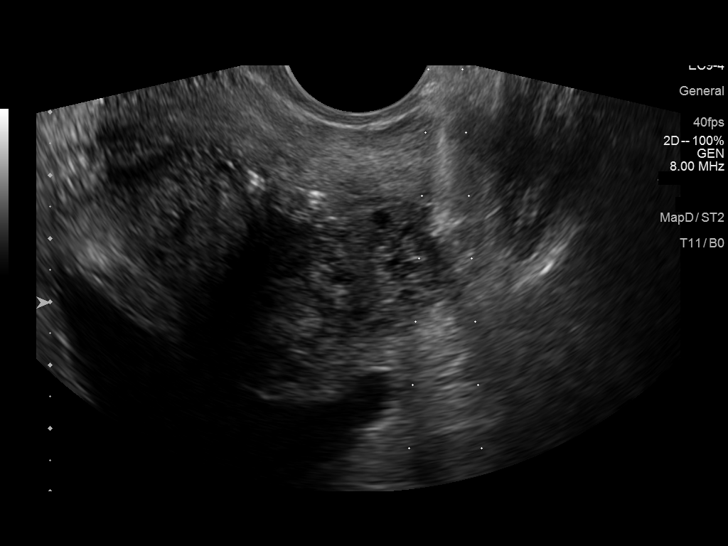
[im 19/21]
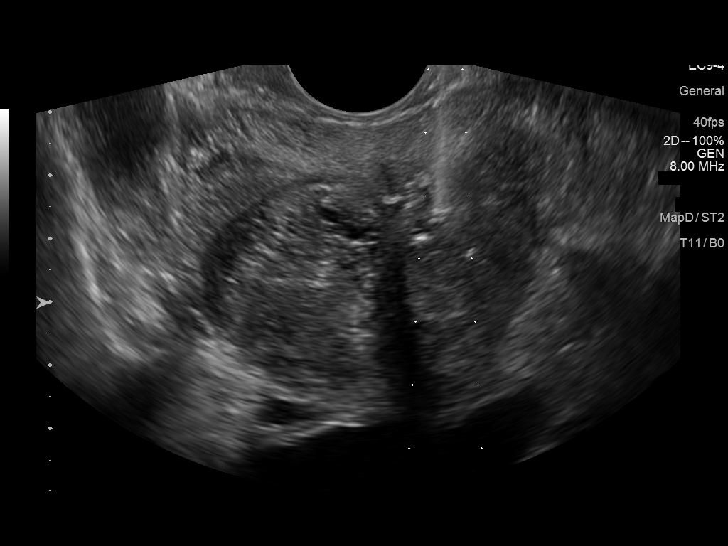
[im 21/21]
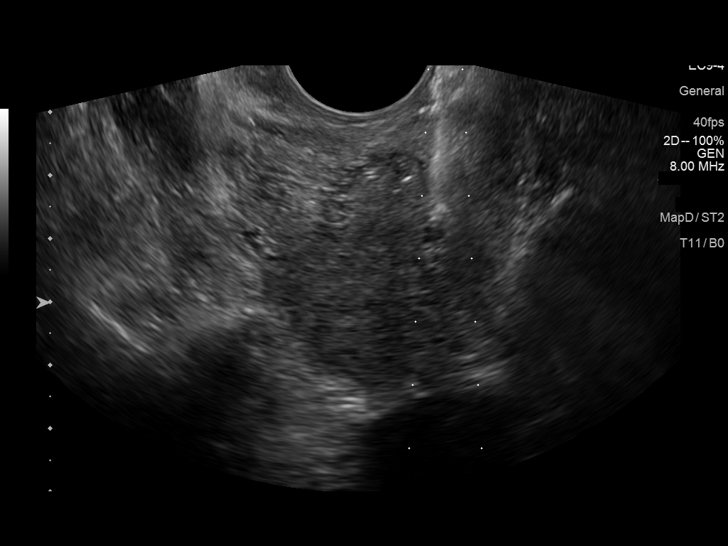

[14 of 16 positions shown; findings below may reference images not displayed]

Canned report from images found in remote index.

Refer to host system for actual result text.

## 2016-03-05 DIAGNOSIS — R972 Elevated prostate specific antigen [PSA]: Secondary | ICD-10-CM | POA: Diagnosis not present

## 2016-03-09 ENCOUNTER — Ambulatory Visit (INDEPENDENT_AMBULATORY_CARE_PROVIDER_SITE_OTHER): Payer: Medicare Other | Admitting: Urology

## 2016-03-09 DIAGNOSIS — R81 Glycosuria: Secondary | ICD-10-CM | POA: Diagnosis not present

## 2016-03-09 DIAGNOSIS — R972 Elevated prostate specific antigen [PSA]: Secondary | ICD-10-CM

## 2016-06-05 DIAGNOSIS — E782 Mixed hyperlipidemia: Secondary | ICD-10-CM | POA: Diagnosis not present

## 2016-06-05 DIAGNOSIS — Z1389 Encounter for screening for other disorder: Secondary | ICD-10-CM | POA: Diagnosis not present

## 2016-06-05 DIAGNOSIS — I10 Essential (primary) hypertension: Secondary | ICD-10-CM | POA: Diagnosis not present

## 2016-06-05 DIAGNOSIS — E663 Overweight: Secondary | ICD-10-CM | POA: Diagnosis not present

## 2016-06-05 DIAGNOSIS — R7309 Other abnormal glucose: Secondary | ICD-10-CM | POA: Diagnosis not present

## 2016-06-05 DIAGNOSIS — Z6825 Body mass index (BMI) 25.0-25.9, adult: Secondary | ICD-10-CM | POA: Diagnosis not present

## 2016-06-05 DIAGNOSIS — Z0001 Encounter for general adult medical examination with abnormal findings: Secondary | ICD-10-CM | POA: Diagnosis not present

## 2016-06-27 DIAGNOSIS — Z23 Encounter for immunization: Secondary | ICD-10-CM | POA: Diagnosis not present

## 2016-06-27 DIAGNOSIS — M5442 Lumbago with sciatica, left side: Secondary | ICD-10-CM | POA: Diagnosis not present

## 2016-06-27 DIAGNOSIS — M9903 Segmental and somatic dysfunction of lumbar region: Secondary | ICD-10-CM | POA: Diagnosis not present

## 2016-06-27 DIAGNOSIS — M47816 Spondylosis without myelopathy or radiculopathy, lumbar region: Secondary | ICD-10-CM | POA: Diagnosis not present

## 2016-07-03 DIAGNOSIS — M9903 Segmental and somatic dysfunction of lumbar region: Secondary | ICD-10-CM | POA: Diagnosis not present

## 2016-07-03 DIAGNOSIS — M47816 Spondylosis without myelopathy or radiculopathy, lumbar region: Secondary | ICD-10-CM | POA: Diagnosis not present

## 2016-07-03 DIAGNOSIS — M5442 Lumbago with sciatica, left side: Secondary | ICD-10-CM | POA: Diagnosis not present

## 2016-07-10 DIAGNOSIS — M47816 Spondylosis without myelopathy or radiculopathy, lumbar region: Secondary | ICD-10-CM | POA: Diagnosis not present

## 2016-07-10 DIAGNOSIS — M9903 Segmental and somatic dysfunction of lumbar region: Secondary | ICD-10-CM | POA: Diagnosis not present

## 2016-07-10 DIAGNOSIS — M5442 Lumbago with sciatica, left side: Secondary | ICD-10-CM | POA: Diagnosis not present

## 2016-07-17 DIAGNOSIS — M5442 Lumbago with sciatica, left side: Secondary | ICD-10-CM | POA: Diagnosis not present

## 2016-07-17 DIAGNOSIS — M47816 Spondylosis without myelopathy or radiculopathy, lumbar region: Secondary | ICD-10-CM | POA: Diagnosis not present

## 2016-07-17 DIAGNOSIS — M9903 Segmental and somatic dysfunction of lumbar region: Secondary | ICD-10-CM | POA: Diagnosis not present

## 2016-07-18 DIAGNOSIS — H35461 Secondary vitreoretinal degeneration, right eye: Secondary | ICD-10-CM | POA: Diagnosis not present

## 2016-07-18 DIAGNOSIS — H5203 Hypermetropia, bilateral: Secondary | ICD-10-CM | POA: Diagnosis not present

## 2016-07-18 DIAGNOSIS — H52223 Regular astigmatism, bilateral: Secondary | ICD-10-CM | POA: Diagnosis not present

## 2016-07-18 DIAGNOSIS — H524 Presbyopia: Secondary | ICD-10-CM | POA: Diagnosis not present

## 2016-08-28 DIAGNOSIS — R972 Elevated prostate specific antigen [PSA]: Secondary | ICD-10-CM | POA: Diagnosis not present

## 2016-08-31 ENCOUNTER — Ambulatory Visit (INDEPENDENT_AMBULATORY_CARE_PROVIDER_SITE_OTHER): Payer: Medicare Other | Admitting: Urology

## 2016-08-31 DIAGNOSIS — N401 Enlarged prostate with lower urinary tract symptoms: Secondary | ICD-10-CM

## 2016-08-31 DIAGNOSIS — R351 Nocturia: Secondary | ICD-10-CM

## 2016-08-31 DIAGNOSIS — R972 Elevated prostate specific antigen [PSA]: Secondary | ICD-10-CM

## 2016-10-02 DIAGNOSIS — Z6826 Body mass index (BMI) 26.0-26.9, adult: Secondary | ICD-10-CM | POA: Diagnosis not present

## 2016-10-02 DIAGNOSIS — K219 Gastro-esophageal reflux disease without esophagitis: Secondary | ICD-10-CM | POA: Diagnosis not present

## 2016-10-02 DIAGNOSIS — J329 Chronic sinusitis, unspecified: Secondary | ICD-10-CM | POA: Diagnosis not present

## 2016-10-02 DIAGNOSIS — I10 Essential (primary) hypertension: Secondary | ICD-10-CM | POA: Diagnosis not present

## 2016-11-08 DIAGNOSIS — L821 Other seborrheic keratosis: Secondary | ICD-10-CM | POA: Diagnosis not present

## 2016-11-08 DIAGNOSIS — D225 Melanocytic nevi of trunk: Secondary | ICD-10-CM | POA: Diagnosis not present

## 2016-11-08 DIAGNOSIS — D0461 Carcinoma in situ of skin of right upper limb, including shoulder: Secondary | ICD-10-CM | POA: Diagnosis not present

## 2016-11-08 DIAGNOSIS — L570 Actinic keratosis: Secondary | ICD-10-CM | POA: Diagnosis not present

## 2016-11-08 DIAGNOSIS — D045 Carcinoma in situ of skin of trunk: Secondary | ICD-10-CM | POA: Diagnosis not present

## 2016-11-08 DIAGNOSIS — X32XXXD Exposure to sunlight, subsequent encounter: Secondary | ICD-10-CM | POA: Diagnosis not present

## 2017-01-15 DIAGNOSIS — E782 Mixed hyperlipidemia: Secondary | ICD-10-CM | POA: Diagnosis not present

## 2017-01-15 DIAGNOSIS — R0602 Shortness of breath: Secondary | ICD-10-CM | POA: Diagnosis not present

## 2017-01-15 DIAGNOSIS — Z1389 Encounter for screening for other disorder: Secondary | ICD-10-CM | POA: Diagnosis not present

## 2017-01-15 DIAGNOSIS — N400 Enlarged prostate without lower urinary tract symptoms: Secondary | ICD-10-CM | POA: Diagnosis not present

## 2017-01-15 DIAGNOSIS — R079 Chest pain, unspecified: Secondary | ICD-10-CM | POA: Diagnosis not present

## 2017-01-15 DIAGNOSIS — E663 Overweight: Secondary | ICD-10-CM | POA: Diagnosis not present

## 2017-01-15 DIAGNOSIS — Z6825 Body mass index (BMI) 25.0-25.9, adult: Secondary | ICD-10-CM | POA: Diagnosis not present

## 2017-01-16 ENCOUNTER — Ambulatory Visit (HOSPITAL_COMMUNITY)
Admission: RE | Admit: 2017-01-16 | Discharge: 2017-01-16 | Disposition: A | Discharge status: Home / Self Care | Payer: Medicare Other | Source: Ambulatory Visit | Attending: Internal Medicine | Admitting: Internal Medicine

## 2017-01-16 ENCOUNTER — Other Ambulatory Visit (HOSPITAL_COMMUNITY): Payer: Self-pay | Admitting: Internal Medicine

## 2017-01-16 DIAGNOSIS — I700 Atherosclerosis of aorta: Secondary | ICD-10-CM | POA: Diagnosis not present

## 2017-01-16 DIAGNOSIS — R0602 Shortness of breath: Secondary | ICD-10-CM | POA: Diagnosis not present

## 2017-01-16 DIAGNOSIS — R0600 Dyspnea, unspecified: Secondary | ICD-10-CM

## 2017-02-12 ENCOUNTER — Ambulatory Visit (INDEPENDENT_AMBULATORY_CARE_PROVIDER_SITE_OTHER): Payer: Medicare Other | Admitting: Cardiology

## 2017-02-12 ENCOUNTER — Encounter: Payer: Self-pay | Admitting: Cardiology

## 2017-02-12 VITALS — BP 170/80 | HR 89 | Ht 69.0 in | Wt 162.0 lb

## 2017-02-12 DIAGNOSIS — I10 Essential (primary) hypertension: Secondary | ICD-10-CM

## 2017-02-12 DIAGNOSIS — R0602 Shortness of breath: Secondary | ICD-10-CM

## 2017-02-12 NOTE — Progress Notes (Signed)
Clinical Summary Mr. Wesley Reynolds is a 83 y.o.male referred as a new patient by Dr Hilma Favors for SOB.   1. SOB - episode of SOB shortly before easter, fullness in stomach. No chest pain. No palpitations. Lasted about 100min-1 hr. Better with belching. The next day had similar episode. No issues since - resumed his regular workouts at gym, walks on treadmill 1.5 miles without troubles.  - no LE edema. No cough, no fevers or chills.     Past Medical History:  Diagnosis Date  . BPH (benign prostatic hyperplasia)   . GI bleed 11/2014  . Hypertension      No Known Allergies   Current Outpatient Prescriptions  Medication Sig Dispense Refill  . aspirin EC 81 MG tablet Take 81 mg by mouth daily.    Marland Kitchen docusate sodium (COLACE) 100 MG capsule Take 1 capsule (100 mg total) by mouth 2 (two) times daily. 10 capsule 0  . Multiple Vitamins-Minerals (MULTIVITAMINS THER. W/MINERALS) TABS Take 1 tablet by mouth daily.    Marland Kitchen NIFEdipine (PROCARDIA XL/ADALAT-CC) 60 MG 24 hr tablet Take 60 mg by mouth 2 (two) times daily.    . pantoprazole (PROTONIX) 40 MG tablet Take 1 tablet (40 mg total) by mouth 2 (two) times daily. 30 tablet 0  . zolpidem (AMBIEN) 5 MG tablet Take 5 mg by mouth at bedtime as needed for sleep.     No current facility-administered medications for this visit.      Past Surgical History:  Procedure Laterality Date  . APPENDECTOMY    . CHOLECYSTECTOMY    . COLONOSCOPY N/A 03/26/2013   Procedure: COLONOSCOPY;  Surgeon: Rogene Houston, MD;  Location: [facility];  Service: Endoscopy;  Laterality: N/A;  830  . CYST EXCISION  1960 ?   spine      No Known Allergies    Family History  Problem Relation Age of Onset  . Cancer Brother      Social History Mr. Friberg reports that he has never smoked. He has never used smokeless tobacco. Mr. Ardis reports that he does not drink alcohol.   Review of Systems CONSTITUTIONAL: No weight loss, fever, chills, weakness or  fatigue.  HEENT: Eyes: No visual loss, blurred vision, double vision or yellow sclerae.No hearing loss, sneezing, congestion, runny nose or sore throat.  SKIN: No rash or itching.  CARDIOVASCULAR: per hpi RESPIRATORY: per hpi GASTROINTESTINAL: No anorexia, nausea, vomiting or diarrhea. No abdominal pain or blood.  GENITOURINARY: No burning on urination, no polyuria NEUROLOGICAL: No headache, dizziness, syncope, paralysis, ataxia, numbness or tingling in the extremities. No change in bowel or bladder control.  MUSCULOSKELETAL: No muscle, back pain, joint pain or stiffness.  LYMPHATICS: No enlarged nodes. No history of splenectomy.  PSYCHIATRIC: No history of depression or anxiety.  ENDOCRINOLOGIC: No reports of sweating, cold or heat intolerance. No polyuria or polydipsia.  Marland Kitchen   Physical Examination Vitals:   02/12/17 1013  BP: (!) 170/80  Pulse: 89   Filed Weights   02/12/17 1013  Weight: 162 lb (73.5 kg)    Gen: resting comfortably, no acute distress HEENT: no scleral icterus, pupils equal round and reactive, no palptable cervical adenopathy,  CV: RRR, no m/r/g, no jvd Resp: Clear to auscultation bilaterally GI: abdomen is soft, non-tender, non-distended, normal bowel sounds, no hepatosplenomegaly MSK: extremities are warm, no edema.  Skin: warm, no rash Neuro:  no focal deficits Psych: appropriate affect      Assessment and  Plan  1. SOB - isolated episode of fairly atypical symptoms. EKG in clinic today shows SR, no ischemic changes, isolated PVC. Tolerates high levels of exertion regularly on the treadmill without issues. At this time continue to monitor, asked to contact us if recurrent or progressing symptoms  2. HTN - elevated in clinic, he reports a history of white coat HTN - he will submit bp log in 1 week, continue current meds at this time.  - given info on DASH diet   F/u 6 months   Arnoldo Lenis, M.D.

## 2017-02-12 NOTE — Patient Instructions (Signed)
Medication Instructions:  Your physician recommends that you continue on your current medications as directed. Please refer to the Current Medication list given to you today.   Labwork: none  Testing/Procedures: none  Follow-Up: Your physician wants you to follow-up in: 6 MONTHS .  You will receive a reminder letter in the mail two months in advance. If you don't receive a letter, please call our office to schedule the follow-up appointment.   Any Other Special Instructions Will Be Listed Below (If Applicable). Please keep a blood pressure log for 1 week. You may call us with the readings or drop it off by our office.     If you need a refill on your cardiac medications before your next appointment, please call your pharmacy.  DASH Eating Plan DASH stands for "Dietary Approaches to Stop Hypertension." The DASH eating plan is a healthy eating plan that has been shown to reduce high blood pressure (hypertension). It may also reduce your risk for type 2 diabetes, heart disease, and stroke. The DASH eating plan may also help with weight loss. What are tips for following this plan? General guidelines   Avoid eating more than 2,300 mg (milligrams) of salt (sodium) a day. If you have hypertension, you may need to reduce your sodium intake to 1,500 mg a day.  Limit alcohol intake to no more than 1 drink a day for nonpregnant women and 2 drinks a day for men. One drink equals 12 oz of beer, 5 oz of wine, or 1 oz of hard liquor.  Work with your health care provider to maintain a healthy body weight or to lose weight. Ask what an ideal weight is for you.  Get at least 30 minutes of exercise that causes your heart to beat faster (aerobic exercise) most days of the week. Activities may include walking, swimming, or biking.  Work with your health care provider or diet and nutrition specialist (dietitian) to adjust your eating plan to your individual calorie needs. Reading food labels   Check  food labels for the amount of sodium per serving. Choose foods with less than 5 percent of the Daily Value of sodium. Generally, foods with less than 300 mg of sodium per serving fit into this eating plan.  To find whole grains, look for the word "whole" as the first word in the ingredient list. Shopping   Buy products labeled as "low-sodium" or "no salt added."  Buy fresh foods. Avoid canned foods and premade or frozen meals. Cooking   Avoid adding salt when cooking. Use salt-free seasonings or herbs instead of table salt or sea salt. Check with your health care provider or pharmacist before using salt substitutes.  Do not fry foods. Cook foods using healthy methods such as baking, boiling, grilling, and broiling instead.  Cook with heart-healthy oils, such as olive, canola, soybean, or sunflower oil. Meal planning    Eat a balanced diet that includes:  5 or more servings of fruits and vegetables each day. At each meal, try to fill half of your plate with fruits and vegetables.  Up to 6-8 servings of whole grains each day.  Less than 6 oz of lean meat, poultry, or fish each day. A 3-oz serving of meat is about the same size as a deck of cards. One egg equals 1 oz.  2 servings of low-fat dairy each day.  A serving of nuts, seeds, or beans 5 times each week.  Heart-healthy fats. Healthy fats called Omega-3 fatty acids are  found in foods such as flaxseeds and coldwater fish, like sardines, salmon, and mackerel.  Limit how much you eat of the following:  Canned or prepackaged foods.  Food that is high in trans fat, such as fried foods.  Food that is high in saturated fat, such as fatty meat.  Sweets, desserts, sugary drinks, and other foods with added sugar.  Full-fat dairy products.  Do not salt foods before eating.  Try to eat at least 2 vegetarian meals each week.  Eat more home-cooked food and less restaurant, buffet, and fast food.  When eating at a restaurant, ask  that your food be prepared with less salt or no salt, if possible. What foods are recommended? The items listed may not be a complete list. Talk with your dietitian about what dietary choices are best for you. Grains  Whole-grain or whole-wheat bread. Whole-grain or whole-wheat pasta. Brown rice. [place]row. Bulgur. Whole-grain and low-sodium cereals. Pita bread. Low-fat, low-sodium crackers. Whole-wheat flour tortillas. Vegetables  Fresh or frozen vegetables (raw, steamed, roasted, or grilled). Low-sodium or reduced-sodium tomato and vegetable juice. Low-sodium or reduced-sodium tomato sauce and tomato paste. Low-sodium or reduced-sodium canned vegetables. Fruits  All fresh, dried, or frozen fruit. Canned fruit in natural juice (without added sugar). Meat and other protein foods  Skinless chicken or [place]. Ground chicken or [place]. Pork with fat trimmed off. Fish and seafood. Egg whites. Dried beans, peas, or lentils. Unsalted nuts, nut butters, and seeds. Unsalted canned beans. Lean cuts of beef with fat trimmed off. Low-sodium, lean deli meat. Dairy  Low-fat (1%) or fat-free (skim) milk. Fat-free, low-fat, or reduced-fat cheeses. Nonfat, low-sodium ricotta or cottage cheese. Low-fat or nonfat yogurt. Low-fat, low-sodium cheese. Fats and oils  Soft margarine without trans fats. Vegetable oil. Low-fat, reduced-fat, or light mayonnaise and salad dressings (reduced-sodium). Canola, safflower, olive, soybean, and sunflower oils. Avocado. Seasoning and other foods  Herbs. Spices. Seasoning mixes without salt. Unsalted popcorn and pretzels. Fat-free sweets. What foods are not recommended? The items listed may not be a complete list. Talk with your dietitian about what dietary choices are best for you. Grains  Baked goods made with fat, such as croissants, muffins, or some breads. Dry pasta or rice meal packs. Vegetables  Creamed or fried vegetables. Vegetables in a cheese sauce. Regular  canned vegetables (not low-sodium or reduced-sodium). Regular canned tomato sauce and paste (not low-sodium or reduced-sodium). Regular tomato and vegetable juice (not low-sodium or reduced-sodium). Angie Fava. Olives. Fruits  Canned fruit in a light or heavy syrup. Fried fruit. Fruit in cream or butter sauce. Meat and other protein foods  Fatty cuts of meat. Ribs. Fried meat. Berniece Salines. Sausage. Bologna and other processed lunch meats. Salami. Fatback. Hotdogs. Bratwurst. Salted nuts and seeds. Canned beans with added salt. Canned or smoked fish. Whole eggs or egg yolks. Chicken or [place] with skin. Dairy  Whole or 2% milk, cream, and half-and-half. Whole or full-fat cream cheese. Whole-fat or sweetened yogurt. Full-fat cheese. Nondairy creamers. Whipped toppings. Processed cheese and cheese spreads. Fats and oils  Butter. Stick margarine. Lard. Shortening. Ghee. Bacon fat. Tropical oils, such as coconut, palm kernel, or palm oil. Seasoning and other foods  Salted popcorn and pretzels. Onion salt, garlic salt, seasoned salt, table salt, and sea salt. Worcestershire sauce. Tartar sauce. Barbecue sauce. Teriyaki sauce. Soy sauce, including reduced-sodium. Steak sauce. Canned and packaged gravies. Fish sauce. Oyster sauce. Cocktail sauce. Horseradish that you find on the shelf. Ketchup. Mustard. Meat flavorings and tenderizers. Bouillon cubes.  Hot sauce and Tabasco sauce. Premade or packaged marinades. Premade or packaged taco seasonings. Relishes. Regular salad dressings. Where to find more information:  National Heart, Lung, and [place]: https://wilson-eaton.com/  American Heart Association: www.heart.org Summary  The DASH eating plan is a healthy eating plan that has been shown to reduce high blood pressure (hypertension). It may also reduce your risk for type 2 diabetes, heart disease, and stroke.  With the DASH eating plan, you should limit salt (sodium) intake to 2,300 mg a day. If you have  hypertension, you may need to reduce your sodium intake to 1,500 mg a day.  When on the DASH eating plan, aim to eat more fresh fruits and vegetables, whole grains, lean proteins, low-fat dairy, and heart-healthy fats.  Work with your health care provider or diet and nutrition specialist (dietitian) to adjust your eating plan to your individual calorie needs. This information is not intended to replace advice given to you by your health care provider. Make sure you discuss any questions you have with your health care provider. Document Released: 09/20/2011 Document Revised: 09/24/2016 Document Reviewed: 09/24/2016 Elsevier Interactive Patient Education  2017 [employer].

## 2017-03-20 ENCOUNTER — Telehealth: Payer: Self-pay

## 2017-03-20 NOTE — Telephone Encounter (Signed)
Called pt. Left detailed message on pt's private voicemail.

## 2017-03-20 NOTE — Telephone Encounter (Signed)
-----   Message from Arnoldo Lenis, MD sent at 03/20/2017  9:48 AM EDT ----- Let pt known bp log from last month reviewed, overall looks fine. No changes  Zandra Abts MD

## 2017-03-28 DIAGNOSIS — Z08 Encounter for follow-up examination after completed treatment for malignant neoplasm: Secondary | ICD-10-CM | POA: Diagnosis not present

## 2017-03-28 DIAGNOSIS — Z85828 Personal history of other malignant neoplasm of skin: Secondary | ICD-10-CM | POA: Diagnosis not present

## 2017-07-25 DIAGNOSIS — L905 Scar conditions and fibrosis of skin: Secondary | ICD-10-CM | POA: Diagnosis not present

## 2017-07-26 DIAGNOSIS — Z23 Encounter for immunization: Secondary | ICD-10-CM | POA: Diagnosis not present

## 2017-08-26 DIAGNOSIS — Z6824 Body mass index (BMI) 24.0-24.9, adult: Secondary | ICD-10-CM | POA: Diagnosis not present

## 2017-08-26 DIAGNOSIS — Z79899 Other long term (current) drug therapy: Secondary | ICD-10-CM | POA: Diagnosis not present

## 2017-08-26 DIAGNOSIS — E782 Mixed hyperlipidemia: Secondary | ICD-10-CM | POA: Diagnosis not present

## 2017-08-26 DIAGNOSIS — Z1389 Encounter for screening for other disorder: Secondary | ICD-10-CM | POA: Diagnosis not present

## 2017-08-26 DIAGNOSIS — Z0000 Encounter for general adult medical examination without abnormal findings: Secondary | ICD-10-CM | POA: Diagnosis not present

## 2017-08-26 DIAGNOSIS — R7309 Other abnormal glucose: Secondary | ICD-10-CM | POA: Diagnosis not present

## 2017-08-26 DIAGNOSIS — Z972 Presence of dental prosthetic device (complete) (partial): Secondary | ICD-10-CM | POA: Diagnosis not present

## 2017-08-26 DIAGNOSIS — I10 Essential (primary) hypertension: Secondary | ICD-10-CM | POA: Diagnosis not present

## 2017-10-04 DIAGNOSIS — R972 Elevated prostate specific antigen [PSA]: Secondary | ICD-10-CM | POA: Diagnosis not present

## 2017-10-18 ENCOUNTER — Ambulatory Visit (INDEPENDENT_AMBULATORY_CARE_PROVIDER_SITE_OTHER): Payer: Medicare Other | Admitting: Urology

## 2017-10-18 DIAGNOSIS — N401 Enlarged prostate with lower urinary tract symptoms: Secondary | ICD-10-CM

## 2017-10-18 DIAGNOSIS — R351 Nocturia: Secondary | ICD-10-CM

## 2017-10-18 DIAGNOSIS — R972 Elevated prostate specific antigen [PSA]: Secondary | ICD-10-CM

## 2017-11-04 DIAGNOSIS — M545 Low back pain: Secondary | ICD-10-CM | POA: Diagnosis not present

## 2017-11-04 DIAGNOSIS — M9903 Segmental and somatic dysfunction of lumbar region: Secondary | ICD-10-CM | POA: Diagnosis not present

## 2017-11-04 DIAGNOSIS — M47816 Spondylosis without myelopathy or radiculopathy, lumbar region: Secondary | ICD-10-CM | POA: Diagnosis not present

## 2017-11-07 DIAGNOSIS — M9903 Segmental and somatic dysfunction of lumbar region: Secondary | ICD-10-CM | POA: Diagnosis not present

## 2017-11-07 DIAGNOSIS — M545 Low back pain: Secondary | ICD-10-CM | POA: Diagnosis not present

## 2017-11-07 DIAGNOSIS — M47816 Spondylosis without myelopathy or radiculopathy, lumbar region: Secondary | ICD-10-CM | POA: Diagnosis not present

## 2017-11-12 DIAGNOSIS — M545 Low back pain: Secondary | ICD-10-CM | POA: Diagnosis not present

## 2017-11-12 DIAGNOSIS — H35461 Secondary vitreoretinal degeneration, right eye: Secondary | ICD-10-CM | POA: Diagnosis not present

## 2017-11-12 DIAGNOSIS — H52223 Regular astigmatism, bilateral: Secondary | ICD-10-CM | POA: Diagnosis not present

## 2017-11-12 DIAGNOSIS — M47816 Spondylosis without myelopathy or radiculopathy, lumbar region: Secondary | ICD-10-CM | POA: Diagnosis not present

## 2017-11-12 DIAGNOSIS — H5203 Hypermetropia, bilateral: Secondary | ICD-10-CM | POA: Diagnosis not present

## 2017-11-12 DIAGNOSIS — M9903 Segmental and somatic dysfunction of lumbar region: Secondary | ICD-10-CM | POA: Diagnosis not present

## 2017-11-12 DIAGNOSIS — H524 Presbyopia: Secondary | ICD-10-CM | POA: Diagnosis not present

## 2017-11-15 DIAGNOSIS — M47816 Spondylosis without myelopathy or radiculopathy, lumbar region: Secondary | ICD-10-CM | POA: Diagnosis not present

## 2017-11-15 DIAGNOSIS — M9903 Segmental and somatic dysfunction of lumbar region: Secondary | ICD-10-CM | POA: Diagnosis not present

## 2017-11-15 DIAGNOSIS — M545 Low back pain: Secondary | ICD-10-CM | POA: Diagnosis not present

## 2017-12-05 DIAGNOSIS — R972 Elevated prostate specific antigen [PSA]: Secondary | ICD-10-CM | POA: Diagnosis not present

## 2017-12-05 DIAGNOSIS — I10 Essential (primary) hypertension: Secondary | ICD-10-CM | POA: Diagnosis not present

## 2017-12-05 DIAGNOSIS — R7309 Other abnormal glucose: Secondary | ICD-10-CM | POA: Diagnosis not present

## 2017-12-05 DIAGNOSIS — Z6824 Body mass index (BMI) 24.0-24.9, adult: Secondary | ICD-10-CM | POA: Diagnosis not present

## 2017-12-05 DIAGNOSIS — N400 Enlarged prostate without lower urinary tract symptoms: Secondary | ICD-10-CM | POA: Diagnosis not present

## 2017-12-05 DIAGNOSIS — Z1389 Encounter for screening for other disorder: Secondary | ICD-10-CM | POA: Diagnosis not present

## 2017-12-05 DIAGNOSIS — E782 Mixed hyperlipidemia: Secondary | ICD-10-CM | POA: Diagnosis not present

## 2017-12-05 DIAGNOSIS — K219 Gastro-esophageal reflux disease without esophagitis: Secondary | ICD-10-CM | POA: Diagnosis not present

## 2017-12-05 DIAGNOSIS — R946 Abnormal results of thyroid function studies: Secondary | ICD-10-CM | POA: Diagnosis not present

## 2018-01-03 ENCOUNTER — Other Ambulatory Visit: Payer: Self-pay | Admitting: Urology

## 2018-01-03 DIAGNOSIS — R972 Elevated prostate specific antigen [PSA]: Secondary | ICD-10-CM

## 2018-01-21 ENCOUNTER — Ambulatory Visit
Admission: RE | Admit: 2018-01-21 | Discharge: 2018-01-21 | Disposition: A | Discharge status: Home / Self Care | Payer: Medicare Other | Source: Ambulatory Visit | Attending: Urology | Admitting: Urology

## 2018-01-21 DIAGNOSIS — R972 Elevated prostate specific antigen [PSA]: Secondary | ICD-10-CM | POA: Diagnosis not present

## 2018-01-21 MED ORDER — GADOBENATE DIMEGLUMINE 529 MG/ML IV SOLN
15.0000 mL | Freq: Once | INTRAVENOUS | Status: AC | PRN
  Administered 2018-01-21: 15 mL via INTRAVENOUS

## 2018-02-14 ENCOUNTER — Ambulatory Visit (INDEPENDENT_AMBULATORY_CARE_PROVIDER_SITE_OTHER): Payer: Medicare Other | Admitting: Urology

## 2018-02-14 DIAGNOSIS — N400 Enlarged prostate without lower urinary tract symptoms: Secondary | ICD-10-CM

## 2018-02-14 DIAGNOSIS — R972 Elevated prostate specific antigen [PSA]: Secondary | ICD-10-CM

## 2018-02-26 DIAGNOSIS — R972 Elevated prostate specific antigen [PSA]: Secondary | ICD-10-CM | POA: Diagnosis not present

## 2018-03-17 DIAGNOSIS — R972 Elevated prostate specific antigen [PSA]: Secondary | ICD-10-CM | POA: Diagnosis not present

## 2018-03-17 DIAGNOSIS — C61 Malignant neoplasm of prostate: Secondary | ICD-10-CM | POA: Diagnosis not present

## 2018-03-17 DIAGNOSIS — D075 Carcinoma in situ of prostate: Secondary | ICD-10-CM | POA: Diagnosis not present

## 2018-03-25 ENCOUNTER — Encounter (INDEPENDENT_AMBULATORY_CARE_PROVIDER_SITE_OTHER): Payer: Self-pay | Admitting: *Deleted

## 2018-03-28 DIAGNOSIS — Z6825 Body mass index (BMI) 25.0-25.9, adult: Secondary | ICD-10-CM | POA: Diagnosis not present

## 2018-03-28 DIAGNOSIS — E663 Overweight: Secondary | ICD-10-CM | POA: Diagnosis not present

## 2018-03-28 DIAGNOSIS — G4700 Insomnia, unspecified: Secondary | ICD-10-CM | POA: Diagnosis not present

## 2018-04-04 ENCOUNTER — Ambulatory Visit (INDEPENDENT_AMBULATORY_CARE_PROVIDER_SITE_OTHER): Payer: Medicare Other | Admitting: Urology

## 2018-04-04 DIAGNOSIS — R972 Elevated prostate specific antigen [PSA]: Secondary | ICD-10-CM

## 2018-04-04 DIAGNOSIS — C61 Malignant neoplasm of prostate: Secondary | ICD-10-CM | POA: Diagnosis not present

## 2018-04-04 DIAGNOSIS — C7951 Secondary malignant neoplasm of bone: Secondary | ICD-10-CM

## 2018-04-09 ENCOUNTER — Other Ambulatory Visit: Payer: Self-pay | Admitting: Urology

## 2018-04-09 DIAGNOSIS — C61 Malignant neoplasm of prostate: Secondary | ICD-10-CM

## 2018-04-15 ENCOUNTER — Encounter (HOSPITAL_COMMUNITY)
Admission: RE | Admit: 2018-04-15 | Discharge: 2018-04-15 | Disposition: A | Discharge status: Home / Self Care | Payer: Medicare Other | Source: Ambulatory Visit | Attending: Urology | Admitting: Urology

## 2018-04-15 DIAGNOSIS — C61 Malignant neoplasm of prostate: Secondary | ICD-10-CM | POA: Insufficient documentation

## 2018-04-15 DIAGNOSIS — R918 Other nonspecific abnormal finding of lung field: Secondary | ICD-10-CM | POA: Diagnosis not present

## 2018-04-15 MED ORDER — AXUMIN (FLUCICLOVINE F 18) INJECTION
9.7200 | Freq: Once | INTRAVENOUS | Status: AC
  Administered 2018-04-15: 9.72 via INTRAVENOUS

## 2018-04-16 ENCOUNTER — Other Ambulatory Visit (INDEPENDENT_AMBULATORY_CARE_PROVIDER_SITE_OTHER): Payer: Self-pay | Admitting: *Deleted

## 2018-04-16 DIAGNOSIS — Z86010 Personal history of colonic polyps: Secondary | ICD-10-CM | POA: Insufficient documentation

## 2018-05-13 DIAGNOSIS — L03032 Cellulitis of left toe: Secondary | ICD-10-CM | POA: Diagnosis not present

## 2018-05-13 DIAGNOSIS — I739 Peripheral vascular disease, unspecified: Secondary | ICD-10-CM | POA: Diagnosis not present

## 2018-05-13 DIAGNOSIS — L03031 Cellulitis of right toe: Secondary | ICD-10-CM | POA: Diagnosis not present

## 2018-05-13 DIAGNOSIS — L600 Ingrowing nail: Secondary | ICD-10-CM | POA: Diagnosis not present

## 2018-05-20 ENCOUNTER — Encounter (INDEPENDENT_AMBULATORY_CARE_PROVIDER_SITE_OTHER): Payer: Self-pay | Admitting: *Deleted

## 2018-05-20 ENCOUNTER — Telehealth (INDEPENDENT_AMBULATORY_CARE_PROVIDER_SITE_OTHER): Payer: Self-pay | Admitting: *Deleted

## 2018-05-20 NOTE — Telephone Encounter (Signed)
Patient needs suprep 

## 2018-05-21 MED ORDER — SUPREP BOWEL PREP KIT 17.5-3.13-1.6 GM/177ML PO SOLN: 1.0000 | Freq: Once | ORAL | 0 refills | Status: AC

## 2018-06-02 ENCOUNTER — Telehealth (INDEPENDENT_AMBULATORY_CARE_PROVIDER_SITE_OTHER): Payer: Self-pay | Admitting: *Deleted

## 2018-06-02 NOTE — Telephone Encounter (Signed)
Referring MD/PCP: golding   Procedure: tcs  Reason/Indication:  Hx polyps  Has patient had this procedure before?  Yes, 2014  If so, when, by whom and where?    Is there a family history of colon cancer?  no  Who?  What age when diagnosed?    Is patient diabetic?   no      Does patient have prosthetic heart valve or mechanical valve?  no  Do you have a pacemaker?  no  Has patient ever had endocarditis? no  Has patient had joint replacement within last 12 months?  no  Is patient constipated or do they take laxatives? no  Does patient have a history of alcohol/drug use?  no  Is patient on blood thinner such as Coumadin, Plavix and/or Aspirin? no  Medications: nifedipine 300 mg 1 tab bid, fluticasone nasal spray, zolpidem 5 mg at bedtime  Allergies: nkda  Medication Adjustment per Dr Lindi Adie, NP:   Procedure date & time: 06/25/18 at 100

## 2018-06-03 NOTE — Telephone Encounter (Signed)
agree

## 2018-06-12 DIAGNOSIS — R972 Elevated prostate specific antigen [PSA]: Secondary | ICD-10-CM | POA: Diagnosis not present

## 2018-06-12 DIAGNOSIS — I10 Essential (primary) hypertension: Secondary | ICD-10-CM | POA: Diagnosis not present

## 2018-06-12 DIAGNOSIS — Z1389 Encounter for screening for other disorder: Secondary | ICD-10-CM | POA: Diagnosis not present

## 2018-06-12 DIAGNOSIS — R7309 Other abnormal glucose: Secondary | ICD-10-CM | POA: Diagnosis not present

## 2018-06-12 DIAGNOSIS — Z79899 Other long term (current) drug therapy: Secondary | ICD-10-CM | POA: Diagnosis not present

## 2018-06-12 DIAGNOSIS — K922 Gastrointestinal hemorrhage, unspecified: Secondary | ICD-10-CM | POA: Diagnosis not present

## 2018-06-12 DIAGNOSIS — E782 Mixed hyperlipidemia: Secondary | ICD-10-CM | POA: Diagnosis not present

## 2018-06-12 DIAGNOSIS — Z6824 Body mass index (BMI) 24.0-24.9, adult: Secondary | ICD-10-CM | POA: Diagnosis not present

## 2018-06-17 DIAGNOSIS — M9903 Segmental and somatic dysfunction of lumbar region: Secondary | ICD-10-CM | POA: Diagnosis not present

## 2018-06-17 DIAGNOSIS — M47816 Spondylosis without myelopathy or radiculopathy, lumbar region: Secondary | ICD-10-CM | POA: Diagnosis not present

## 2018-06-19 DIAGNOSIS — M47816 Spondylosis without myelopathy or radiculopathy, lumbar region: Secondary | ICD-10-CM | POA: Diagnosis not present

## 2018-06-19 DIAGNOSIS — M9903 Segmental and somatic dysfunction of lumbar region: Secondary | ICD-10-CM | POA: Diagnosis not present

## 2018-06-25 ENCOUNTER — Other Ambulatory Visit: Payer: Self-pay

## 2018-06-25 ENCOUNTER — Encounter (HOSPITAL_COMMUNITY)
Admission: RE | Disposition: A | Discharge status: Home / Self Care | Payer: Self-pay | Source: Ambulatory Visit | Attending: Internal Medicine

## 2018-06-25 ENCOUNTER — Ambulatory Visit (HOSPITAL_COMMUNITY)
Admission: RE | Admit: 2018-06-25 | Discharge: 2018-06-25 | Disposition: A | Discharge status: Home / Self Care | Payer: Medicare Other | Source: Ambulatory Visit | Attending: Internal Medicine | Admitting: Internal Medicine

## 2018-06-25 ENCOUNTER — Encounter (HOSPITAL_COMMUNITY): Payer: Self-pay | Admitting: *Deleted

## 2018-06-25 DIAGNOSIS — K648 Other hemorrhoids: Secondary | ICD-10-CM | POA: Insufficient documentation

## 2018-06-25 DIAGNOSIS — K5730 Diverticulosis of large intestine without perforation or abscess without bleeding: Secondary | ICD-10-CM | POA: Insufficient documentation

## 2018-06-25 DIAGNOSIS — Z1211 Encounter for screening for malignant neoplasm of colon: Secondary | ICD-10-CM | POA: Diagnosis not present

## 2018-06-25 DIAGNOSIS — Z860100 Personal history of colon polyps, unspecified: Secondary | ICD-10-CM | POA: Insufficient documentation

## 2018-06-25 DIAGNOSIS — N400 Enlarged prostate without lower urinary tract symptoms: Secondary | ICD-10-CM | POA: Diagnosis not present

## 2018-06-25 DIAGNOSIS — Z79899 Other long term (current) drug therapy: Secondary | ICD-10-CM | POA: Insufficient documentation

## 2018-06-25 DIAGNOSIS — I10 Essential (primary) hypertension: Secondary | ICD-10-CM | POA: Diagnosis not present

## 2018-06-25 DIAGNOSIS — Z09 Encounter for follow-up examination after completed treatment for conditions other than malignant neoplasm: Secondary | ICD-10-CM | POA: Diagnosis not present

## 2018-06-25 HISTORY — PX: COLONOSCOPY: SHX5424

## 2018-06-25 SURGERY — COLONOSCOPY
Anesthesia: Moderate Sedation

## 2018-06-25 MED ORDER — STERILE WATER FOR IRRIGATION IR SOLN
Status: DC | PRN
  Administered 2018-06-25: 13:00:00

## 2018-06-25 MED ORDER — MIDAZOLAM HCL 5 MG/5ML IJ SOLN
INTRAMUSCULAR | Status: AC
  Filled 2018-06-25: qty 10

## 2018-06-25 MED ORDER — MEPERIDINE HCL 50 MG/ML IJ SOLN
INTRAMUSCULAR | Status: AC
  Filled 2018-06-25: qty 1

## 2018-06-25 MED ORDER — MIDAZOLAM HCL 5 MG/5ML IJ SOLN
INTRAMUSCULAR | Status: DC | PRN
  Administered 2018-06-25 (×4): 1 mg via INTRAVENOUS

## 2018-06-25 MED ORDER — SODIUM CHLORIDE 0.9 % IV SOLN
INTRAVENOUS | Status: DC
  Administered 2018-06-25: 1000 mL via INTRAVENOUS

## 2018-06-25 MED ORDER — MEPERIDINE HCL 50 MG/ML IJ SOLN
INTRAMUSCULAR | Status: DC | PRN
  Administered 2018-06-25: 15 mg via INTRAVENOUS
  Administered 2018-06-25: 25 mg via INTRAVENOUS
  Administered 2018-06-25: 10 mg via INTRAVENOUS

## 2018-06-25 NOTE — Discharge Instructions (Signed)
Hemorrhoids °Hemorrhoids are swollen veins in and around the rectum or anus. There are two types of hemorrhoids: °· Internal hemorrhoids. These occur in the veins that are just inside the rectum. They may poke through to the outside and become irritated and painful. °· External hemorrhoids. These occur in the veins that are outside of the anus and can be felt as a painful swelling or hard lump near the anus. ° °Most hemorrhoids do not cause serious problems, and they can be managed with home treatments such as diet and lifestyle changes. If home treatments do not help your symptoms, procedures can be done to shrink or remove the hemorrhoids. °What are the causes? °This condition is caused by increased pressure in the anal area. This pressure may result from various things, including: °· Constipation. °· Straining to have a bowel movement. °· Diarrhea. °· Pregnancy. °· Obesity. °· Sitting for long periods of time. °· Heavy lifting or other activity that causes you to strain. °· Anal sex. ° °What are the signs or symptoms? °Symptoms of this condition include: °· Pain. °· Anal itching or irritation. °· Rectal bleeding. °· Leakage of stool (feces). °· Anal swelling. °· One or more lumps around the anus. ° °How is this diagnosed? °This condition can often be diagnosed through a visual exam. Other exams or tests may also be done, such as: °· Examination of the rectal area with a gloved hand (digital rectal exam). °· Examination of the anal canal using a small tube (anoscope). °· A blood test, if you have lost a significant amount of blood. °· A test to look inside the colon (sigmoidoscopy or colonoscopy). ° °How is this treated? °This condition can usually be treated at home. However, various procedures may be done if dietary changes, lifestyle changes, and other home treatments do not help your symptoms. These procedures can help make the hemorrhoids smaller or remove them completely. Some of these procedures involve  surgery, and others do not. Common procedures include: °· Rubber band ligation. Rubber bands are placed at the base of the hemorrhoids to cut off the blood supply to them. °· Sclerotherapy. Medicine is injected into the hemorrhoids to shrink them. °· Infrared coagulation. A type of light energy is used to get rid of the hemorrhoids. °· Hemorrhoidectomy surgery. The hemorrhoids are surgically removed, and the veins that supply them are tied off. °· Stapled hemorrhoidopexy surgery. A circular stapling device is used to remove the hemorrhoids and use staples to cut off the blood supply to them. ° °Follow these instructions at home: °Eating and drinking °· Eat foods that have a lot of fiber in them, such as whole grains, beans, nuts, fruits, and vegetables. Ask your health care provider about taking products that have added fiber (fiber supplements). °· Drink enough fluid to keep your urine clear or pale yellow. °Managing pain and swelling °· Take warm sitz baths for 20 minutes, 3-4 times a day to ease pain and discomfort. °· If directed, apply ice to the affected area. Using ice packs between sitz baths may be helpful. °? Put ice in a plastic bag. °? Place a towel between your skin and the bag. °? Leave the ice on for 20 minutes, 2-3 times a day. °General instructions °· Take over-the-counter and prescription medicines only as told by your health care provider. °· Use medicated creams or suppositories as told. °· Exercise regularly. °· Go to the bathroom when you have the urge to have a bowel movement. Do not wait. °·   Avoid straining to have bowel movements.  Keep the anal area dry and clean. Use wet toilet paper or moist towelettes after a bowel movement.  Do not sit on the toilet for long periods of time. This increases blood pooling and pain. Contact a health care provider if:  You have increasing pain and swelling that are not controlled by treatment or medicine.  You have uncontrolled bleeding.  You  have difficulty having a bowel movement, or you are unable to have a bowel movement.  You have pain or inflammation outside the area of the hemorrhoids. This information is not intended to replace advice given to you by your health care provider. Make sure you discuss any questions you have with your health care provider. Document Released: 09/28/2000 Document Revised: 02/29/2016 Document Reviewed: 06/15/2015 Elsevier Interactive Patient Education  2018 [employer]. Colonoscopy, Adult, Care After This sheet gives you information about how to care for yourself after your procedure. Your health care provider may also give you more specific instructions. If you have problems or questions, contact your health care provider. What can I expect after the procedure? After the procedure, it is common to have:  A small amount of blood in your stool for 24 hours after the procedure.  Some gas.  Mild abdominal cramping or bloating.  Follow these instructions at home: General instructions   For the first 24 hours after the procedure: ? Do not drive or use machinery. ? Do not sign important documents. ? Do not drink alcohol. ? Do your regular daily activities at a slower pace than normal. ? Eat soft, easy-to-digest foods. ? Rest often.  Take over-the-counter or prescription medicines only as told by your health care provider.  It is up to you to get the results of your procedure. Ask your health care provider, or the department performing the procedure, when your results will be ready. Relieving cramping and bloating  Try walking around when you have cramps or feel bloated.  Apply heat to your abdomen as told by your health care provider. Use a heat source that your health care provider recommends, such as a moist heat pack or a heating pad. ? Place a towel between your skin and the heat source. ? Leave the heat on for 20-30 minutes. ? Remove the heat if your skin turns bright red. This is  especially important if you are unable to feel pain, heat, or cold. You may have a greater risk of getting burned. Eating and drinking  Drink enough fluid to keep your urine clear or pale yellow.  Resume your normal diet as instructed by your health care provider. Avoid heavy or fried foods that are hard to digest.  Avoid drinking alcohol for as long as instructed by your health care provider. Contact a health care provider if:  You have blood in your stool 2-3 days after the procedure. Get help right away if:  You have more than a small spotting of blood in your stool.  You pass large blood clots in your stool.  Your abdomen is swollen.  You have nausea or vomiting.  You have a fever.  You have increasing abdominal pain that is not relieved with medicine. This information is not intended to replace advice given to you by your health care provider. Make sure you discuss any questions you have with your health care provider. Document Released: 05/15/2004 Document Revised: 06/25/2016 Document Reviewed: 12/13/2015 Elsevier Interactive Patient Education  2018 [employer]. Diverticulosis Diverticulosis is a condition  that develops when small pouches (diverticula) form in the wall of the large intestine (colon). The colon is where water is absorbed and stool is formed. The pouches form when the inside layer of the colon pushes through weak spots in the outer layers of the colon. You may have a few pouches or many of them. What are the causes? The cause of this condition is not known. What increases the risk? The following factors may make you more likely to develop this condition:  Being older than age 55. Your risk for this condition increases with age. Diverticulosis is rare among people younger than age 15. By age 12, many people have it.  Eating a low-fiber diet.  Having frequent constipation.  Being overweight.  Not getting enough exercise.  Smoking.  Taking  over-the-counter pain medicines, like aspirin and ibuprofen.  Having a family history of diverticulosis.  What are the signs or symptoms? In most people, there are no symptoms of this condition. If you do have symptoms, they may include:  Bloating.  Cramps in the abdomen.  Constipation or diarrhea.  Pain in the lower left side of the abdomen.  How is this diagnosed? This condition is most often diagnosed during an exam for other colon problems. Because diverticulosis usually has no symptoms, it often cannot be diagnosed independently. This condition may be diagnosed by:  Using a flexible scope to examine the colon (colonoscopy).  Taking an X-ray of the colon after dye has been put into the colon (barium enema).  Doing a CT scan.  How is this treated? You may not need treatment for this condition if you have never developed an infection related to diverticulosis. If you have had an infection before, treatment may include:  Eating a high-fiber diet. This may include eating more fruits, vegetables, and grains.  Taking a fiber supplement.  Taking a live bacteria supplement (probiotic).  Taking medicine to relax your colon.  Taking antibiotic medicines.  Follow these instructions at home:  Drink 6-8 glasses of water or more each day to prevent constipation.  Try not to strain when you have a bowel movement.  If you have had an infection before: ? Eat more fiber as directed by your health care provider or your diet and nutrition specialist (dietitian). ? Take a fiber supplement or probiotic, if your health care provider approves.  Take over-the-counter and prescription medicines only as told by your health care provider.  If you were prescribed an antibiotic, take it as told by your health care provider. Do not stop taking the antibiotic even if you start to feel better.  Keep all follow-up visits as told by your health care provider. This is important. Contact a health  care provider if:  You have pain in your abdomen.  You have bloating.  You have cramps.  You have not had a bowel movement in 3 days. Get help right away if:  Your pain gets worse.  Your bloating becomes very bad.  You have a fever or chills, and your symptoms suddenly get worse.  You vomit.  You have bowel movements that are bloody or black.  You have bleeding from your rectum. Summary  Diverticulosis is a condition that develops when small pouches (diverticula) form in the wall of the large intestine (colon).  You may have a few pouches or many of them.  This condition is most often diagnosed during an exam for other colon problems.  If you have had an infection related to diverticulosis,  treatment may include increasing the fiber in your diet, taking supplements, or taking medicines. This information is not intended to replace advice given to you by your health care provider. Make sure you discuss any questions you have with your health care provider. Document Released: 06/28/2004 Document Revised: 08/20/2016 Document Reviewed: 08/20/2016 Elsevier Interactive Patient Education  2017 [place] usual medications as before. High-fiber diet. No driving for 24 hours.

## 2018-06-25 NOTE — H&P (Signed)
Wesley Reynolds is an 84 y.o. male.   Chief Complaint: Patient is here for colonoscopy. HPI: Patient is 84 year old Caucasian male who has history of colonic adenomas and is here for surveillance colonoscopy.  His last exam was in 2014.  He is doing quite well and wanted to undergo another exam.  He denies abdominal pain change in bowel habits or rectal bleeding.  He states aspirin was stopped by Dr. Hilma Favors several months ago.  He does not take other OTC NSAIDs.  He stays busy.  He goes to the gym 3 times a week. Family history is negative for CRC.  Past Medical History:  Diagnosis Date  . BPH (benign prostatic hyperplasia)   . GI bleed 11/2014  . Hypertension     Past Surgical History:  Procedure Laterality Date  . APPENDECTOMY    . CHOLECYSTECTOMY    . COLONOSCOPY N/A 03/26/2013   Procedure: COLONOSCOPY;  Surgeon: Rogene Houston, MD;  Location: [facility];  Service: Endoscopy;  Laterality: N/A;  830  . CYST EXCISION  1960 ?   spine     Family History  Problem Relation Age of Onset  . Cancer Brother    Social History:  reports that he has never smoked. He has never used smokeless tobacco. He reports that he does not drink alcohol or use drugs.  Allergies: No Known Allergies  Medications Prior to Admission  Medication Sig Dispense Refill  . fluticasone (FLONASE) 50 MCG/ACT nasal spray Place 2 sprays into both nostrils 2 (two) times daily.    . Multiple Vitamins-Minerals (MULTIVITAMINS THER. W/MINERALS) TABS Take 1 tablet by mouth daily.    Marland Kitchen NIFEdipine (PROCARDIA-XL/ADALAT-CC/NIFEDICAL-XL) 30 MG 24 hr tablet Take 30 mg by mouth 2 (two) times daily.  0  . Omega-3 Fatty Acids (FISH OIL) 1000 MG CAPS Take 1,000 mg by mouth daily.    Marland Kitchen zolpidem (AMBIEN) 5 MG tablet Take 5 mg by mouth at bedtime.     Marland Kitchen acetaminophen (TYLENOL) 500 MG tablet Take 500 mg by mouth at bedtime.      No results found for this or any previous visit (from the past 48 hour(s)). No results  found.  ROS  Blood pressure (!) 180/75, pulse 84, temperature 98.3 F (36.8 C), temperature source Oral, resp. rate (!) 21, height 5\' 9"  (1.753 m), weight 72.6 kg, SpO2 100 %. Physical Exam  Constitutional:  Well-developed thin Caucasian male in NAD.  HENT:  Mouth/Throat: Oropharynx is clear and moist.  Eyes: Conjunctivae are normal. No scleral icterus.  Neck: No thyromegaly present.  Cardiovascular: Normal rate, regular rhythm and normal heart sounds.  No murmur heard. Respiratory: Effort normal and breath sounds normal.  GI:  Abdomen is symmetrical with right paramedian scar.  Abdomen is soft and nontender with organomegaly or masses.  Musculoskeletal: He exhibits no edema.  Lymphadenopathy:    He has no cervical adenopathy.  Skin: Skin is warm and dry.     Assessment/Plan History of colonic adenomas. Surveillance colonoscopy.  Hildred Laser, MD 06/25/2018, 1:23 PM

## 2018-06-25 NOTE — Op Note (Signed)
[facility] Patient Name: Wesley Reynolds Procedure Date: 06/25/2018 12:56 PM MRN: [id number] Date of Birth: 1930/12/26 Attending MD: Hildred Laser , MD CSN: [id number] Age: 84 Admit Type: Outpatient Procedure:                Colonoscopy Indications:              High risk colon cancer surveillance: Personal                            history of colonic polyps Providers:                Hildred Laser, MD, Otis Peak B. Sharon Seller, RN, Hinton Rao, RN Referring MD:             Halford Chessman MD, MD Medicines:                Meperidine 50 mg IV, Midazolam 4 mg IV Complications:            No immediate complications. Estimated Blood Loss:     Estimated blood loss: none. Procedure:                Pre-Anesthesia Assessment:                           - Prior to the procedure, a History and Physical                            was performed, and patient medications and                            allergies were reviewed. The patient's tolerance of                            previous anesthesia was also reviewed. The risks                            and benefits of the procedure and the sedation                            options and risks were discussed with the patient.                            All questions were answered, and informed consent                            was obtained. Prior Anticoagulants: The patient has                            taken no previous anticoagulant or antiplatelet                            agents. ASA Grade Assessment: II - A patient with  mild systemic disease. After reviewing the risks                            and benefits, the patient was deemed in                            satisfactory condition to undergo the procedure.                           After obtaining informed consent, the colonoscope                            was passed under direct vision. Throughout the   procedure, the patient's blood pressure, pulse, and                            oxygen saturations were monitored continuously. The                            [id number]-[id number] ([id number]) scope was introduced through                            the anus and advanced to the the cecum, identified                            by appendiceal orifice and ileocecal valve. The                            colonoscopy was performed without difficulty. The                            patient tolerated the procedure well. The quality                            of the bowel preparation was adequate. The                            ileocecal valve, appendiceal orifice, and rectum                            were photographed. Scope In: 1:34:38 PM Scope Out: 1:52:34 PM Scope Withdrawal Time: 0 hours 7 minutes 50 seconds  Total Procedure Duration: 0 hours 17 minutes 56 seconds  Findings:      The perianal and digital rectal examinations were normal.      Multiple small and large-mouthed diverticula were found in the entire       colon.      The exam was otherwise normal throughout the examined colon.      Internal hemorrhoids were found during retroflexion. The hemorrhoids       were small. Impression:               - Diverticulosis in the entire examined colon.                           - Internal hemorrhoids.                           -  No specimens collected. Moderate Sedation:      Moderate (conscious) sedation was administered by the endoscopy nurse       and supervised by the endoscopist. The following parameters were       monitored: oxygen saturation, heart rate, blood pressure, CO2       capnography and response to care. Total physician intraservice time was       24 minutes. Recommendation:           - Patient has a contact number available for                            emergencies. The signs and symptoms of potential                            delayed complications were discussed with the                             patient. Return to normal activities tomorrow.                            Written discharge instructions were provided to the                            patient.                           - High fiber diet today.                           - Continue present medications.                           - No repeat colonoscopy due to age and the absence                            of advanced adenomas. Procedure Code(s):        --- Professional ---                           [phone], Colonoscopy, flexible; diagnostic, including                            collection of specimen(s) by brushing or washing,                            when performed (separate procedure)                           G0500, Moderate sedation services provided by the                            same physician or other qualified health care                            professional performing a gastrointestinal  endoscopic service that sedation supports,                            requiring the presence of an independent trained                            observer to assist in the monitoring of the                            patient's level of consciousness and physiological                            status; initial 15 minutes of intra-service time;                            patient age 64 years or older (additional time may                            be reported with [phone], as appropriate)                           [phone], Moderate sedation services provided by the                            same physician or other qualified health care                            professional performing the diagnostic or                            therapeutic service that the sedation supports,                            requiring the presence of an independent trained                            observer to assist in the monitoring of the                            patient's level of consciousness and  physiological                            status; each additional 15 minutes intraservice                            time (List separately in addition to code for                            primary service) Diagnosis Code(s):        --- Professional ---                           Z86.010, Personal history of colonic polyps  K64.8, Other hemorrhoids                           K57.30, Diverticulosis of large intestine without                            perforation or abscess without bleeding CPT copyright 2017 [employer]. All rights reserved. The codes documented in this report are preliminary and upon coder review may  be revised to meet current compliance requirements. Hildred Laser, MD Hildred Laser, MD 06/25/2018 2:01:50 PM This report has been signed electronically. Number of Addenda: 0

## 2018-07-01 ENCOUNTER — Encounter (HOSPITAL_COMMUNITY): Payer: Self-pay | Admitting: Internal Medicine

## 2018-07-01 DIAGNOSIS — Z6824 Body mass index (BMI) 24.0-24.9, adult: Secondary | ICD-10-CM | POA: Diagnosis not present

## 2018-07-01 DIAGNOSIS — J069 Acute upper respiratory infection, unspecified: Secondary | ICD-10-CM | POA: Diagnosis not present

## 2018-07-25 DIAGNOSIS — Z23 Encounter for immunization: Secondary | ICD-10-CM | POA: Diagnosis not present

## 2018-08-07 DIAGNOSIS — C44529 Squamous cell carcinoma of skin of other part of trunk: Secondary | ICD-10-CM | POA: Diagnosis not present

## 2018-08-12 DIAGNOSIS — I739 Peripheral vascular disease, unspecified: Secondary | ICD-10-CM | POA: Diagnosis not present

## 2018-08-12 DIAGNOSIS — L600 Ingrowing nail: Secondary | ICD-10-CM | POA: Diagnosis not present

## 2018-08-12 DIAGNOSIS — L03031 Cellulitis of right toe: Secondary | ICD-10-CM | POA: Diagnosis not present

## 2018-08-12 DIAGNOSIS — L03032 Cellulitis of left toe: Secondary | ICD-10-CM | POA: Diagnosis not present

## 2018-08-12 DIAGNOSIS — C61 Malignant neoplasm of prostate: Secondary | ICD-10-CM | POA: Diagnosis not present

## 2018-08-15 ENCOUNTER — Ambulatory Visit (INDEPENDENT_AMBULATORY_CARE_PROVIDER_SITE_OTHER): Payer: Medicare Other | Admitting: Urology

## 2018-08-15 DIAGNOSIS — R351 Nocturia: Secondary | ICD-10-CM | POA: Diagnosis not present

## 2018-08-15 DIAGNOSIS — R81 Glycosuria: Secondary | ICD-10-CM | POA: Diagnosis not present

## 2018-08-15 DIAGNOSIS — C61 Malignant neoplasm of prostate: Secondary | ICD-10-CM | POA: Diagnosis not present

## 2018-08-15 DIAGNOSIS — R972 Elevated prostate specific antigen [PSA]: Secondary | ICD-10-CM | POA: Diagnosis not present

## 2018-08-27 DIAGNOSIS — N401 Enlarged prostate with lower urinary tract symptoms: Secondary | ICD-10-CM | POA: Diagnosis not present

## 2018-08-27 DIAGNOSIS — E663 Overweight: Secondary | ICD-10-CM | POA: Diagnosis not present

## 2018-08-27 DIAGNOSIS — J329 Chronic sinusitis, unspecified: Secondary | ICD-10-CM | POA: Diagnosis not present

## 2018-08-27 DIAGNOSIS — I10 Essential (primary) hypertension: Secondary | ICD-10-CM | POA: Diagnosis not present

## 2018-08-27 DIAGNOSIS — Z6825 Body mass index (BMI) 25.0-25.9, adult: Secondary | ICD-10-CM | POA: Diagnosis not present

## 2018-09-08 DIAGNOSIS — Z85828 Personal history of other malignant neoplasm of skin: Secondary | ICD-10-CM | POA: Diagnosis not present

## 2018-09-08 DIAGNOSIS — Z08 Encounter for follow-up examination after completed treatment for malignant neoplasm: Secondary | ICD-10-CM | POA: Diagnosis not present

## 2018-09-08 DIAGNOSIS — L905 Scar conditions and fibrosis of skin: Secondary | ICD-10-CM | POA: Diagnosis not present

## 2018-09-24 DIAGNOSIS — Z1389 Encounter for screening for other disorder: Secondary | ICD-10-CM | POA: Diagnosis not present

## 2018-09-24 DIAGNOSIS — Z0001 Encounter for general adult medical examination with abnormal findings: Secondary | ICD-10-CM | POA: Diagnosis not present

## 2018-09-24 DIAGNOSIS — K922 Gastrointestinal hemorrhage, unspecified: Secondary | ICD-10-CM | POA: Diagnosis not present

## 2018-09-24 DIAGNOSIS — E782 Mixed hyperlipidemia: Secondary | ICD-10-CM | POA: Diagnosis not present

## 2018-09-24 DIAGNOSIS — I10 Essential (primary) hypertension: Secondary | ICD-10-CM | POA: Diagnosis not present

## 2018-09-24 DIAGNOSIS — Z6825 Body mass index (BMI) 25.0-25.9, adult: Secondary | ICD-10-CM | POA: Diagnosis not present

## 2018-10-06 IMAGING — PT NM PET NOPR SKULL BASE TO THIGH
1 of 7 series · 2 of 25 positions shown · non-contrast
Comparison: None.

CLINICAL DATA: Recurrent prostate carcinoma.  Gleason score 7.

EXAM:
NUCLEAR MEDICINE PET SKULL BASE TO THIGH
TECHNIQUE: mCi F-18 Fluciclovine was injected intravenously. Full-ring PET
imaging was performed from the skull base to thigh after the
radiotracer. CT data was obtained and used for attenuation
correction and anatomic localization.

[Series 4: ct sk_thigh 5.0 b31f · axial · 5.0mm · 0.98mm/px · z∈[+487,+683]mm · 2 of 246 slices shown]
[im 50/246  brain]
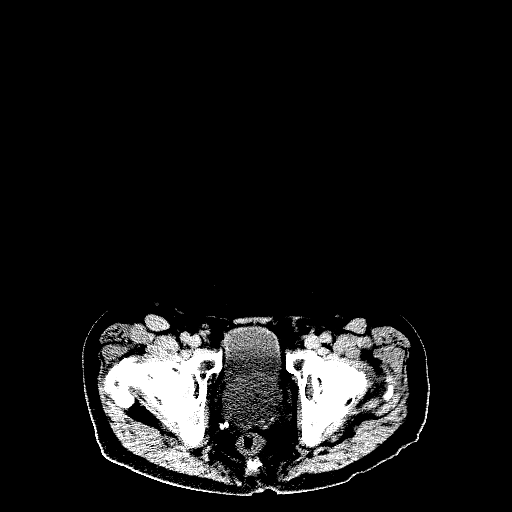
[im 99/246  brain]
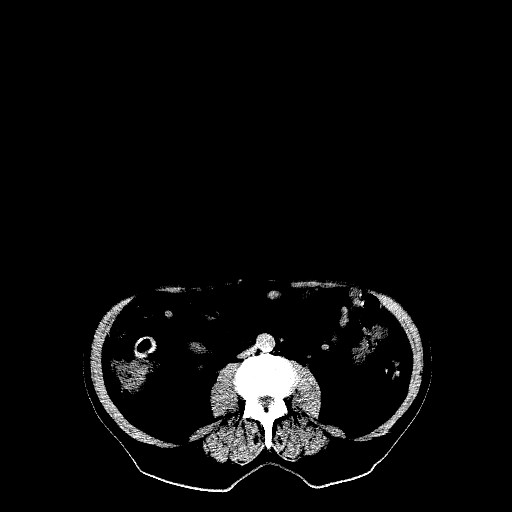

[2 of 25 positions shown; findings below may reference images not displayed]

FINDINGS: NECK

No radiotracer activity in neck lymph nodes.

Incidental CT finding: None

CHEST

No radiotracer accumulation within mediastinal or hilar lymph nodes.
4 mm noncalcified pulmonary nodule is seen in anterior left lower
lobe on image [DATE], which shows no radiotracer uptake.

Incidental CT finding: Calcified granuloma in the peripheral right
upper lobe. Aortic atherosclerosis. Coronary artery calcification.

ABDOMEN/PELVIS

Prostate: Moderately enlarged, with relatively diffuse radiotracer
uptake seen mainly central gland.

Lymph nodes: No abnormal radiotracer accumulation within pelvic or
abdominal nodes.

Liver: No evidence of liver metastasis

Incidental CT finding: Diffuse colonic diverticulosis, without
evidence of diverticulitis.

SKELETON

Sclerotic lesions are seen in the left ilium sacrum, but these show
no radiotracer uptake, and most likely represent benign bone
islands.
IMPRESSION: Moderately enlarged prostate gland with radiotracer uptake seen
mainly in the central gland.

No evidence of lymph node or other metastatic disease.

## 2018-10-20 ENCOUNTER — Emergency Department (HOSPITAL_COMMUNITY)
Admission: EM | Admit: 2018-10-20 | Discharge: 2018-10-20 | Disposition: A | Discharge status: Home / Self Care | Payer: Medicare Other | Attending: Emergency Medicine | Admitting: Emergency Medicine

## 2018-10-20 ENCOUNTER — Other Ambulatory Visit: Payer: Self-pay

## 2018-10-20 ENCOUNTER — Encounter (HOSPITAL_COMMUNITY): Payer: Self-pay | Admitting: *Deleted

## 2018-10-20 ENCOUNTER — Emergency Department (HOSPITAL_COMMUNITY): Payer: Medicare Other

## 2018-10-20 DIAGNOSIS — R51 Headache: Secondary | ICD-10-CM | POA: Diagnosis not present

## 2018-10-20 DIAGNOSIS — Z79899 Other long term (current) drug therapy: Secondary | ICD-10-CM | POA: Diagnosis not present

## 2018-10-20 DIAGNOSIS — I444 Left anterior fascicular block: Secondary | ICD-10-CM | POA: Diagnosis not present

## 2018-10-20 DIAGNOSIS — I10 Essential (primary) hypertension: Secondary | ICD-10-CM | POA: Diagnosis not present

## 2018-10-20 DIAGNOSIS — R519 Headache, unspecified: Secondary | ICD-10-CM

## 2018-10-20 NOTE — ED Provider Notes (Signed)
[facility] EMERGENCY DEPARTMENT Provider Note   CSN: [id number] Arrival date & time: 10/20/18  2046     History   Chief Complaint Chief Complaint  Patient presents with  . Headache    HPI HARMON BOMMARITO is a 85 y.o. male.  HPI Patient has been using Flonase nasal spray for several days for congestion.  States this evening he began having frontal "head pressure".  This is worse when he stood up better when he laid down.  Denies any visual changes.  No focal weakness or numbness.  No nausea or vomiting.  States his symptoms have significantly improved.  No fever or chills.  No neck pain or stiffness.  Patient states he is been compliant with his medication. Past Medical History:  Diagnosis Date  . BPH (benign prostatic hyperplasia)   . GI bleed 11/2014  . Hypertension     Patient Active Problem List   Diagnosis Date Noted  . History of colonic polyps 04/16/2018  . Protein-calorie malnutrition, severe ([place]) 11/19/2014  . Rectal bleeding 11/17/2014  . Dysphagia 11/17/2014  . Essential hypertension 11/17/2014  . BPH (benign prostatic hyperplasia) 11/17/2014  . Insomnia 11/17/2014    Past Surgical History:  Procedure Laterality Date  . APPENDECTOMY    . CHOLECYSTECTOMY    . COLONOSCOPY N/A 03/26/2013   Procedure: COLONOSCOPY;  Surgeon: Rogene Houston, MD;  [facility];  Service: Endoscopy;  Laterality: N/A;  830  . COLONOSCOPY N/A 06/25/2018   Procedure: COLONOSCOPY;  Surgeon: Rogene Houston, MD;  Location: [facility];  Service: Endoscopy;  Laterality: N/A;  200  . CYST EXCISION  1960 ?   spine         Home Medications    Prior to Admission medications   Medication Sig Start Date End Date Taking? Authorizing Provider  acetaminophen (TYLENOL) 500 MG tablet Take 500 mg by mouth at bedtime.   Yes [provider]  fluticasone (FLONASE) 50 MCG/ACT nasal spray Place 2 sprays into both nostrils 2 (two) times daily.   Yes [provider]    Multiple Vitamins-Minerals (MULTIVITAMINS THER. W/MINERALS) TABS Take 1 tablet by mouth daily.   Yes [provider]  NIFEdipine (PROCARDIA-XL/ADALAT-CC/NIFEDICAL-XL) 30 MG 24 hr tablet Take 30 mg by mouth 2 (two) times daily. 04/30/18  Yes [provider]  Omega-3 Fatty Acids (FISH OIL) 1000 MG CAPS Take 1,000 mg by mouth daily.   Yes [provider]  zolpidem (AMBIEN) 5 MG tablet Take 5 mg by mouth at bedtime.    Yes [provider]    Family History Family History  Problem Relation Age of Onset  . Cancer Brother     Social History Social History   Tobacco Use  . Smoking status: Never Smoker  . Smokeless tobacco: Never Used  Substance Use Topics  . Alcohol use: No  . Drug use: Never     Allergies   Patient has no known allergies.   Review of Systems Review of Systems  Constitutional: Negative for chills and fever.  HENT: Positive for congestion. Negative for sinus pressure, sore throat and trouble swallowing.   Eyes: Negative for photophobia and visual disturbance.  Respiratory: Negative for cough and shortness of breath.   Cardiovascular: Negative for chest pain.  Gastrointestinal: Negative for abdominal pain, diarrhea, nausea and vomiting.  Genitourinary: Negative for dysuria, flank pain and frequency.  Musculoskeletal: Negative for back pain, myalgias and neck pain.  Skin: Negative for rash and wound.  Neurological: Positive for headaches. Negative for dizziness, speech difficulty, weakness, light-headedness and numbness.  All other systems reviewed and are negative.    Physical Exam Updated Vital Signs BP 131/85   Pulse 82   Temp 97.6 F (36.4 C) (Oral)   Resp 18   Ht 5\' 9"  (1.753 m)   Wt 75.8 kg   SpO2 98%   BMI 24.66 kg/m   Physical Exam Vitals signs and nursing note reviewed.  Constitutional:      General: He is not in acute distress.    Appearance: Normal appearance. He is well-developed. He is not  ill-appearing.  HENT:     Head: Normocephalic and atraumatic.     Comments: No sinus tenderness to percussion.  No temporal artery tenderness.    Nose: Congestion present.  Eyes:     Extraocular Movements: Extraocular movements intact.     Conjunctiva/sclera: Conjunctivae normal.     Pupils: Pupils are equal, round, and reactive to light.  Neck:     Musculoskeletal: Normal range of motion and neck supple. No neck rigidity or muscular tenderness.  Cardiovascular:     Rate and Rhythm: Normal rate and regular rhythm.     Heart sounds: No murmur. No friction rub. No gallop.   Pulmonary:     Effort: Pulmonary effort is normal.     Breath sounds: Normal breath sounds.  Abdominal:     General: Bowel sounds are normal.     Palpations: Abdomen is soft.     Tenderness: There is no abdominal tenderness. There is no guarding or rebound.  Musculoskeletal: Normal range of motion.        General: No tenderness.  Skin:    General: Skin is warm and dry.     Findings: No erythema or rash.  Neurological:     General: No focal deficit present.     Mental Status: He is alert and oriented to person, place, and time.     Comments: Patient is alert and oriented x3 with clear, goal oriented speech. Patient has 5/5 motor in all extremities. Sensation is intact to light touch.  Ambulates without difficulty  Psychiatric:        Mood and Affect: Mood normal.        Behavior: Behavior normal.      ED Treatments / Results  Labs (all labs ordered are listed, but only abnormal results are displayed) Labs Reviewed - No data to display  EKG EKG Interpretation  Date/Time:  Monday October 20 2018 21:05:00 EST Ventricular Rate:  87 PR Interval:  184 QRS Duration: 88 QT Interval:  364 QTC Calculation: 438 R Axis:   -45 Text Interpretation:  Sinus rhythm with frequent Premature ventricular complexes and Fusion complexes Left anterior fascicular block Moderate voltage criteria for LVH, may be normal  variant Abnormal ECG since last tracing no significant change Confirmed by Daleen Bo [phone]) on 10/20/2018 9:13:05 PM   Radiology Ct Head Wo Contrast  Result Date: 10/20/2018 CLINICAL DATA:  Headache. EXAM: CT HEAD WITHOUT CONTRAST TECHNIQUE: Contiguous axial images were obtained from the base of the skull through the vertex without intravenous contrast. COMPARISON:  None. FINDINGS: Brain: Mild diffuse cortical atrophy is noted. Mild chronic ischemic white matter disease is noted. No mass effect or midline shift is noted. Ventricular size is within normal limits. There is no evidence of mass lesion, hemorrhage or acute infarction. Vascular: No hyperdense vessel or unexpected calcification. Skull: Normal. Negative for fracture or focal lesion. Sinuses/Orbits: No acute  finding. Other: None. IMPRESSION: Mild diffuse cortical atrophy. Mild chronic ischemic white matter disease. No acute intracranial abnormality seen. Electronically Signed   By: Marijo Conception, M.D.   On: 10/20/2018 21:44    Procedures Procedures (including critical care time)  Medications Ordered in ED Medications - No data to display   Initial Impression / Assessment and Plan / ED Course  I have reviewed the triage vital signs and the nursing notes.  Pertinent labs & imaging results that were available during my care of the patient were reviewed by me and considered in my medical decision making (see chart for details).     Blood pressure was initially high but has improved.  Patient states his pain has resolved.  He has a normal neurologic exam.  CT head without acute findings.  Advised to follow-up with his primary physician and return precautions given.  Final Clinical Impressions(s) / ED Diagnoses   Final diagnoses:  Acute nonintractable headache, unspecified headache type    ED Discharge Orders    None       Julianne Rice, MD 10/20/18 2230

## 2018-10-20 NOTE — ED Triage Notes (Signed)
Pt states that he was drinking hot chocolate tonight when he suddenly started having "head pressure" , states that he feels better when he lays down, denies any problems with one side being weak, no problems with speech, states that he has had this happen once before last week and it went away,

## 2018-10-20 NOTE — ED Notes (Signed)
ED Provider at bedside. 

## 2018-10-23 DIAGNOSIS — E860 Dehydration: Secondary | ICD-10-CM | POA: Diagnosis not present

## 2018-10-23 DIAGNOSIS — I10 Essential (primary) hypertension: Secondary | ICD-10-CM | POA: Diagnosis not present

## 2018-10-23 DIAGNOSIS — Z79899 Other long term (current) drug therapy: Secondary | ICD-10-CM | POA: Diagnosis not present

## 2018-10-23 DIAGNOSIS — Z6825 Body mass index (BMI) 25.0-25.9, adult: Secondary | ICD-10-CM | POA: Diagnosis not present

## 2018-10-23 DIAGNOSIS — E663 Overweight: Secondary | ICD-10-CM | POA: Diagnosis not present

## 2018-11-11 DIAGNOSIS — L03031 Cellulitis of right toe: Secondary | ICD-10-CM | POA: Diagnosis not present

## 2018-11-11 DIAGNOSIS — M79672 Pain in left foot: Secondary | ICD-10-CM | POA: Diagnosis not present

## 2018-11-11 DIAGNOSIS — L03032 Cellulitis of left toe: Secondary | ICD-10-CM | POA: Diagnosis not present

## 2018-11-11 DIAGNOSIS — I739 Peripheral vascular disease, unspecified: Secondary | ICD-10-CM | POA: Diagnosis not present

## 2018-11-11 DIAGNOSIS — M79675 Pain in left toe(s): Secondary | ICD-10-CM | POA: Diagnosis not present

## 2018-11-11 DIAGNOSIS — M79674 Pain in right toe(s): Secondary | ICD-10-CM | POA: Diagnosis not present

## 2018-11-11 DIAGNOSIS — M79671 Pain in right foot: Secondary | ICD-10-CM | POA: Diagnosis not present

## 2018-11-11 DIAGNOSIS — L600 Ingrowing nail: Secondary | ICD-10-CM | POA: Diagnosis not present

## 2018-11-12 DIAGNOSIS — H5203 Hypermetropia, bilateral: Secondary | ICD-10-CM | POA: Diagnosis not present

## 2018-11-12 DIAGNOSIS — H524 Presbyopia: Secondary | ICD-10-CM | POA: Diagnosis not present

## 2018-11-12 DIAGNOSIS — H35461 Secondary vitreoretinal degeneration, right eye: Secondary | ICD-10-CM | POA: Diagnosis not present

## 2018-11-12 DIAGNOSIS — H52223 Regular astigmatism, bilateral: Secondary | ICD-10-CM | POA: Diagnosis not present

## 2019-01-08 DIAGNOSIS — S134XXA Sprain of ligaments of cervical spine, initial encounter: Secondary | ICD-10-CM | POA: Diagnosis not present

## 2019-01-08 DIAGNOSIS — M9903 Segmental and somatic dysfunction of lumbar region: Secondary | ICD-10-CM | POA: Diagnosis not present

## 2019-01-08 DIAGNOSIS — S233XXA Sprain of ligaments of thoracic spine, initial encounter: Secondary | ICD-10-CM | POA: Diagnosis not present

## 2019-01-08 DIAGNOSIS — M9902 Segmental and somatic dysfunction of thoracic region: Secondary | ICD-10-CM | POA: Diagnosis not present

## 2019-01-08 DIAGNOSIS — S338XXA Sprain of other parts of lumbar spine and pelvis, initial encounter: Secondary | ICD-10-CM | POA: Diagnosis not present

## 2019-01-08 DIAGNOSIS — M9901 Segmental and somatic dysfunction of cervical region: Secondary | ICD-10-CM | POA: Diagnosis not present

## 2019-01-15 DIAGNOSIS — G4700 Insomnia, unspecified: Secondary | ICD-10-CM | POA: Diagnosis not present

## 2019-02-06 DIAGNOSIS — C61 Malignant neoplasm of prostate: Secondary | ICD-10-CM | POA: Diagnosis not present

## 2019-02-26 DIAGNOSIS — R972 Elevated prostate specific antigen [PSA]: Secondary | ICD-10-CM | POA: Diagnosis not present

## 2019-02-26 DIAGNOSIS — R5383 Other fatigue: Secondary | ICD-10-CM | POA: Diagnosis not present

## 2019-02-26 DIAGNOSIS — G4700 Insomnia, unspecified: Secondary | ICD-10-CM | POA: Diagnosis not present

## 2019-02-26 DIAGNOSIS — E538 Deficiency of other specified B group vitamins: Secondary | ICD-10-CM | POA: Diagnosis not present

## 2019-02-26 DIAGNOSIS — I10 Essential (primary) hypertension: Secondary | ICD-10-CM | POA: Diagnosis not present

## 2019-02-26 DIAGNOSIS — Z6825 Body mass index (BMI) 25.0-25.9, adult: Secondary | ICD-10-CM | POA: Diagnosis not present

## 2019-02-26 DIAGNOSIS — R7309 Other abnormal glucose: Secondary | ICD-10-CM | POA: Diagnosis not present

## 2019-02-26 DIAGNOSIS — E663 Overweight: Secondary | ICD-10-CM | POA: Diagnosis not present

## 2019-04-21 DIAGNOSIS — C61 Malignant neoplasm of prostate: Secondary | ICD-10-CM | POA: Diagnosis not present

## 2019-04-24 ENCOUNTER — Ambulatory Visit (INDEPENDENT_AMBULATORY_CARE_PROVIDER_SITE_OTHER): Payer: Medicare Other | Admitting: Urology

## 2019-04-24 ENCOUNTER — Other Ambulatory Visit: Payer: Self-pay

## 2019-04-24 DIAGNOSIS — R972 Elevated prostate specific antigen [PSA]: Secondary | ICD-10-CM | POA: Diagnosis not present

## 2019-04-24 DIAGNOSIS — C61 Malignant neoplasm of prostate: Secondary | ICD-10-CM

## 2019-05-05 DIAGNOSIS — M9901 Segmental and somatic dysfunction of cervical region: Secondary | ICD-10-CM | POA: Diagnosis not present

## 2019-05-05 DIAGNOSIS — S338XXA Sprain of other parts of lumbar spine and pelvis, initial encounter: Secondary | ICD-10-CM | POA: Diagnosis not present

## 2019-05-05 DIAGNOSIS — M9902 Segmental and somatic dysfunction of thoracic region: Secondary | ICD-10-CM | POA: Diagnosis not present

## 2019-05-05 DIAGNOSIS — S233XXA Sprain of ligaments of thoracic spine, initial encounter: Secondary | ICD-10-CM | POA: Diagnosis not present

## 2019-05-05 DIAGNOSIS — S134XXA Sprain of ligaments of cervical spine, initial encounter: Secondary | ICD-10-CM | POA: Diagnosis not present

## 2019-05-05 DIAGNOSIS — M9903 Segmental and somatic dysfunction of lumbar region: Secondary | ICD-10-CM | POA: Diagnosis not present

## 2019-05-13 DIAGNOSIS — I739 Peripheral vascular disease, unspecified: Secondary | ICD-10-CM | POA: Diagnosis not present

## 2019-05-13 DIAGNOSIS — Z1389 Encounter for screening for other disorder: Secondary | ICD-10-CM | POA: Diagnosis not present

## 2019-05-13 DIAGNOSIS — M1991 Primary osteoarthritis, unspecified site: Secondary | ICD-10-CM | POA: Diagnosis not present

## 2019-05-13 DIAGNOSIS — Z6825 Body mass index (BMI) 25.0-25.9, adult: Secondary | ICD-10-CM | POA: Diagnosis not present

## 2019-05-14 ENCOUNTER — Other Ambulatory Visit (HOSPITAL_COMMUNITY): Payer: Self-pay | Admitting: Family Medicine

## 2019-05-14 ENCOUNTER — Other Ambulatory Visit: Payer: Self-pay | Admitting: Family Medicine

## 2019-05-14 DIAGNOSIS — I739 Peripheral vascular disease, unspecified: Secondary | ICD-10-CM

## 2019-05-14 DIAGNOSIS — M1991 Primary osteoarthritis, unspecified site: Secondary | ICD-10-CM

## 2019-05-22 ENCOUNTER — Ambulatory Visit (HOSPITAL_COMMUNITY)
Admission: RE | Admit: 2019-05-22 | Discharge: 2019-05-22 | Disposition: A | Discharge status: Home / Self Care | Payer: Medicare Other | Source: Ambulatory Visit | Attending: Family Medicine | Admitting: Family Medicine

## 2019-05-22 ENCOUNTER — Other Ambulatory Visit: Payer: Self-pay

## 2019-05-22 DIAGNOSIS — Z8679 Personal history of other diseases of the circulatory system: Secondary | ICD-10-CM | POA: Diagnosis not present

## 2019-05-22 DIAGNOSIS — M1991 Primary osteoarthritis, unspecified site: Secondary | ICD-10-CM | POA: Diagnosis not present

## 2019-05-22 DIAGNOSIS — I739 Peripheral vascular disease, unspecified: Secondary | ICD-10-CM | POA: Insufficient documentation

## 2019-07-23 DIAGNOSIS — Z23 Encounter for immunization: Secondary | ICD-10-CM | POA: Diagnosis not present

## 2019-08-07 ENCOUNTER — Other Ambulatory Visit: Payer: Self-pay

## 2019-08-07 ENCOUNTER — Ambulatory Visit (INDEPENDENT_AMBULATORY_CARE_PROVIDER_SITE_OTHER): Payer: Medicare Other | Admitting: Urology

## 2019-08-07 DIAGNOSIS — N401 Enlarged prostate with lower urinary tract symptoms: Secondary | ICD-10-CM | POA: Diagnosis not present

## 2019-08-07 DIAGNOSIS — R351 Nocturia: Secondary | ICD-10-CM | POA: Diagnosis not present

## 2019-08-07 DIAGNOSIS — C61 Malignant neoplasm of prostate: Secondary | ICD-10-CM

## 2019-08-07 DIAGNOSIS — R001 Bradycardia, unspecified: Secondary | ICD-10-CM

## 2019-08-07 DIAGNOSIS — R972 Elevated prostate specific antigen [PSA]: Secondary | ICD-10-CM

## 2019-08-19 DIAGNOSIS — E663 Overweight: Secondary | ICD-10-CM | POA: Diagnosis not present

## 2019-08-19 DIAGNOSIS — Z6825 Body mass index (BMI) 25.0-25.9, adult: Secondary | ICD-10-CM | POA: Diagnosis not present

## 2019-08-19 DIAGNOSIS — G4700 Insomnia, unspecified: Secondary | ICD-10-CM | POA: Diagnosis not present

## 2019-10-06 ENCOUNTER — Encounter: Payer: Self-pay | Admitting: Urology

## 2019-11-16 ENCOUNTER — Other Ambulatory Visit: Payer: Self-pay | Admitting: Urology

## 2019-11-16 DIAGNOSIS — C61 Malignant neoplasm of prostate: Secondary | ICD-10-CM | POA: Diagnosis not present

## 2019-11-17 LAB — PSA: PSA: 23.6 ng/mL — ABNORMAL HIGH (ref <=4.0)

## 2019-11-20 ENCOUNTER — Ambulatory Visit: Payer: Medicare Other | Admitting: Urology

## 2019-11-20 ENCOUNTER — Telehealth: Payer: Self-pay

## 2019-11-20 NOTE — Telephone Encounter (Signed)
-----   Message from Irine Seal, MD sent at 11/19/2019  7:46 AM EST ----- His PSA is stable.  Keep f/u as planned.  ----- Message ----- From: Dorisann Frames, RN Sent: 11/17/2019   1:12 PM EST To: Irine Seal, MD  Lab for review

## 2019-11-20 NOTE — Telephone Encounter (Signed)
Pt notified of results

## 2019-11-26 DIAGNOSIS — Z23 Encounter for immunization: Secondary | ICD-10-CM | POA: Diagnosis not present

## 2019-12-25 DIAGNOSIS — Z23 Encounter for immunization: Secondary | ICD-10-CM | POA: Diagnosis not present

## 2020-01-05 DIAGNOSIS — Z0000 Encounter for general adult medical examination without abnormal findings: Secondary | ICD-10-CM | POA: Diagnosis not present

## 2020-01-05 DIAGNOSIS — R7309 Other abnormal glucose: Secondary | ICD-10-CM | POA: Diagnosis not present

## 2020-01-05 DIAGNOSIS — E7849 Other hyperlipidemia: Secondary | ICD-10-CM | POA: Diagnosis not present

## 2020-01-05 DIAGNOSIS — R972 Elevated prostate specific antigen [PSA]: Secondary | ICD-10-CM | POA: Diagnosis not present

## 2020-01-05 DIAGNOSIS — I10 Essential (primary) hypertension: Secondary | ICD-10-CM | POA: Diagnosis not present

## 2020-01-05 DIAGNOSIS — M1991 Primary osteoarthritis, unspecified site: Secondary | ICD-10-CM | POA: Diagnosis not present

## 2020-01-05 DIAGNOSIS — Z1389 Encounter for screening for other disorder: Secondary | ICD-10-CM | POA: Diagnosis not present

## 2020-01-05 DIAGNOSIS — Z6825 Body mass index (BMI) 25.0-25.9, adult: Secondary | ICD-10-CM | POA: Diagnosis not present

## 2020-01-05 DIAGNOSIS — N401 Enlarged prostate with lower urinary tract symptoms: Secondary | ICD-10-CM | POA: Diagnosis not present

## 2020-01-21 NOTE — Progress Notes (Signed)
Subjective:  1. Prostate cancer ([name])   2. Elevated PSA   3. Nocturia   4. Urgency of urination       I have prostate cancer.     Mr. Wesley Reynolds returns today in f/u for his history of prostate cancer. He has intermediate risk disease on recent biopsy and his PSA was 23.6 on 11/16/19 with the prior PSA of 22.9 on 08/07/19 which was stable over the last 3 months and in his usual range for the last 24 months.  He had an Axumin PET on 04/15/18 that showed only prostate uptake. His last biopsy was an MRI fusion biopsy on 03/17/18. He had a total 19 biopsied done. There was Gleason 6 in 30% of a single core from the ROI1 in the right TZ out of the 4 biopsies. The 3 biopsies from the left PZ ROI were negative. he had 2 cores on the left with 5% Gleason 6 and a single core at the right apex with Gleason 7(3+4) in 40%. His prostate is 26ml. On the MRI there was several sclerotic lesions in the pelvic bones that are suspicious for mets but that was not confirmed on the PET. He was biopsied because the PSA had jumped up to 27 but it fell back to 18 prior to the biopsy and has otherwise been stable for the last 2.5-3 years. He is voiding well but has nocturia 2-3x and has some time going back to sleep.  He has some urgency as well.  He has no bone pain or weight loss. He has gained some weight. His IPSS is 4. He has no associated signs or symptoms.     IPSS    Row Name 01/22/20 1000         International Prostate Symptom Score   How often have you had the sensation of not emptying your bladder?  Not at All     How often have you had to urinate less than every two hours?  Less than half the time     How often have you found you stopped and started again several times when you urinated?  Not at All     How often have you found it difficult to postpone urination?  Not at All     How often have you had a weak urinary stream?  Not at All     How often have you had to strain to start urination?  Not at All     How  many times did you typically get up at night to urinate?  2 Times     Total IPSS Score  4       Quality of Life due to urinary symptoms   If you were to spend the rest of your life with your urinary condition just the way it is now how would you feel about that?  Pleased         ROS:  ROS:  A complete review of systems was performed.  All systems are negative except for pertinent findings as noted.   Review of Systems  All other systems reviewed and are negative.   No Known Allergies  Outpatient Encounter Medications as of 01/22/2020  Medication Sig  . acetaminophen (TYLENOL) 500 MG tablet Take 500 mg by mouth at bedtime.  . fluticasone (FLONASE) 50 MCG/ACT nasal spray Place 2 sprays into both nostrils 2 (two) times daily.  Marland Kitchen ipratropium (ATROVENT) 0.03 % nasal spray Place 2 sprays into both nostrils 3 (three)  times daily.  . Multiple Vitamins-Minerals (MULTIVITAMINS THER. W/MINERALS) TABS Take 1 tablet by mouth daily.  Marland Kitchen NIFEdipine (PROCARDIA-XL/ADALAT-CC/NIFEDICAL-XL) 30 MG 24 hr tablet Take 30 mg by mouth 2 (two) times daily.  . Omega-3 Fatty Acids (FISH OIL) 1000 MG CAPS Take 1,000 mg by mouth daily.  Marland Kitchen zolpidem (AMBIEN) 10 MG tablet Take 10 mg by mouth at bedtime as needed.  . zolpidem (AMBIEN) 5 MG tablet Take 5 mg by mouth at bedtime.    No facility-administered encounter medications on file as of 01/22/2020.    Past Medical History:  Diagnosis Date  . BPH (benign prostatic hyperplasia)   . GI bleed 11/2014  . Hypertension     Past Surgical History:  Procedure Laterality Date  . APPENDECTOMY    . CHOLECYSTECTOMY    . COLONOSCOPY N/A 03/26/2013   Procedure: COLONOSCOPY;  Surgeon: Rogene Houston, MD;  [facility];  Service: Endoscopy;  Laterality: N/A;  830  . COLONOSCOPY N/A 06/25/2018   Procedure: COLONOSCOPY;  Surgeon: Rogene Houston, MD;  [facility];  Service: Endoscopy;  Laterality: N/A;  200  . CYST EXCISION  1960 ?   spine      Social History   Socioeconomic History  . Marital status: Married    Spouse name: Not on file  . Number of children: Not on file  . Years of education: Not on file  . Highest education level: Not on file  Occupational History  . Not on file  Tobacco Use  . Smoking status: Never Smoker  . Smokeless tobacco: Never Used  Substance and Sexual Activity  . Alcohol use: No  . Drug use: Never  . Sexual activity: Not on file  Other Topics Concern  . Not on file  Social History Narrative  . Not on file   Social Determinants of Health   Financial Resource Strain:   . Difficulty of Paying Living Expenses:   Food Insecurity:   . Worried About Charity fundraiser in the Last Year:   . Arboriculturist in the Last Year:   Transportation Needs:   . Film/video editor (Medical):   Marland Kitchen Lack of Transportation (Non-Medical):   Physical Activity:   . Days of Exercise per Week:   . Minutes of Exercise per Session:   Stress:   . Feeling of Stress :   Social Connections:   . Frequency of Communication with Friends and Family:   . Frequency of Social Gatherings with Friends and Family:   . Attends Religious Services:   . Active Member of Clubs or Organizations:   . Attends Archivist Meetings:   Marland Kitchen Marital Status:   Intimate Partner Violence:   . Fear of Current or Ex-Partner:   . Emotionally Abused:   Marland Kitchen Physically Abused:   . Sexually Abused:     Family History  Problem Relation Age of Onset  . Cancer Brother        Objective: BP (!) 146/76   Pulse 73   Temp (!) 97.5 F (36.4 C)   Ht 5\' 9"  (1.753 m)   Wt 166 lb (75.3 kg)   BMI 24.51 kg/m     Physical Exam Vitals reviewed.  Constitutional:      Appearance: Normal appearance.  Genitourinary:    Comments: Rectal Exam: NST. Prostate 2.5+ without nodules.   SV's non-palpable.  Neurological:     Mental Status: He is alert.     Lab Results:  Results for orders placed or performed in visit on  01/22/20 (from the past 24 hour(s))  POCT urinalysis dipstick     Status: Abnormal   Collection Time: 01/22/20 10:14 AM  Result Value Ref Range   Color, UA yellow    Clarity, UA     Glucose, UA Positive (A) Negative   Bilirubin, UA neg    Ketones, UA neg    Spec Grav, UA 1.015 1.010 - 1.025   Blood, UA neg    pH, UA 7.0 5.0 - 8.0   Protein, UA Negative Negative   Urobilinogen, UA 0.2 0.2 or 1.0 E.U./dL   Nitrite, UA neg    Leukocytes, UA Negative Negative   Appearance clear    Odor      BMET No results for input(s): NA, K, CL, CO2, GLUCOSE, BUN, CREATININE, CALCIUM in the last 72 hours. PSA PSA  Date Value Ref Range Status  11/16/2019 23.6 (H) < OR = 4.0 ng/mL Final    Comment:    The total PSA value from this assay system is  standardized against the WHO standard. The test  result will be approximately 20% lower when compared  to the equimolar-standardized total PSA (Beckman  Coulter). Comparison of serial PSA results should be  interpreted with this fact in mind. . This test was performed using the Siemens  chemiluminescent method. Values obtained from  different assay methods cannot be used interchangeably. PSA levels, regardless of value, should not be interpreted as absolute evidence of the presence or absence of disease.    No results found for: TESTOSTERONE    Studies/Results: No results found.    Assessment & Plan: His PSA continues to rise slowly and his exam remains benign.  At his age I think the most appropriate option is to continue q6 mo f/u with PSA's.   If he does progress, androgen deprivation, possibly with RTx would be most appropriate.  He has some nocturia and urgency but otherwise minimal LUTS so I don't think he is a candidate for finasteride.    No orders of the defined types were placed in this encounter.    Orders Placed This Encounter  Procedures  . PSA, total and free    Standing Status:   Future    Standing Expiration Date:    01/21/2021  . POCT urinalysis dipstick      Return in about 6 months (around 07/23/2020) for with PSA.   CC: Sharilyn Sites, MD      Irine Seal 01/22/2020

## 2020-01-22 ENCOUNTER — Encounter: Payer: Self-pay | Admitting: Urology

## 2020-01-22 ENCOUNTER — Other Ambulatory Visit: Payer: Self-pay

## 2020-01-22 ENCOUNTER — Ambulatory Visit (INDEPENDENT_AMBULATORY_CARE_PROVIDER_SITE_OTHER): Payer: Medicare Other | Admitting: Urology

## 2020-01-22 VITALS — BP 146/76 | HR 73 | Temp 97.5°F | Ht 69.0 in | Wt 166.0 lb

## 2020-01-22 DIAGNOSIS — R3915 Urgency of urination: Secondary | ICD-10-CM | POA: Diagnosis not present

## 2020-01-22 DIAGNOSIS — R351 Nocturia: Secondary | ICD-10-CM

## 2020-01-22 DIAGNOSIS — R972 Elevated prostate specific antigen [PSA]: Secondary | ICD-10-CM | POA: Diagnosis not present

## 2020-01-22 DIAGNOSIS — C61 Malignant neoplasm of prostate: Secondary | ICD-10-CM

## 2020-01-22 LAB — POCT URINALYSIS DIPSTICK
Bilirubin, UA: NEGATIVE
Blood, UA: NEGATIVE
Glucose, UA: POSITIVE — AB
Ketones, UA: NEGATIVE
Leukocytes, UA: NEGATIVE
Nitrite, UA: NEGATIVE
Protein, UA: NEGATIVE
Spec Grav, UA: 1.015 (ref 1.010–1.025)
Urobilinogen, UA: 0.2 E.U./dL
pH, UA: 7 (ref 5.0–8.0)

## 2020-02-25 ENCOUNTER — Encounter: Payer: Self-pay | Admitting: Podiatry

## 2020-02-25 ENCOUNTER — Ambulatory Visit: Payer: Medicare Other

## 2020-02-25 ENCOUNTER — Ambulatory Visit (INDEPENDENT_AMBULATORY_CARE_PROVIDER_SITE_OTHER): Payer: Medicare Other | Admitting: Podiatry

## 2020-02-25 ENCOUNTER — Other Ambulatory Visit: Payer: Self-pay

## 2020-02-25 DIAGNOSIS — M2141 Flat foot [pes planus] (acquired), right foot: Secondary | ICD-10-CM

## 2020-02-25 DIAGNOSIS — B351 Tinea unguium: Secondary | ICD-10-CM

## 2020-02-25 DIAGNOSIS — M79676 Pain in unspecified toe(s): Secondary | ICD-10-CM | POA: Diagnosis not present

## 2020-02-25 DIAGNOSIS — Q663 Other congenital varus deformities of feet, unspecified foot: Secondary | ICD-10-CM | POA: Diagnosis not present

## 2020-02-25 NOTE — Progress Notes (Signed)
Subjective:  Patient ID: Wesley Reynolds, male    DOB: Mar 09, 1931,  MRN: [id number] HPI Chief Complaint  Patient presents with  . Leg Pain    Patient states he notices when he walks his left foot rolls out causing the back of the leg and knee pain, tried OTC arch supports-no help  . Nail Problem    Requesting nail trim - unable to cut himself, says too thick  . New Patient (Initial Visit)    86 y.o. male presents with the above complaint.   ROS: Denies fever chills nausea vomiting muscle aches pains calf pain back pain chest pain shortness of breath.  Past Medical History:  Diagnosis Date  . BPH (benign prostatic hyperplasia)   . GI bleed 11/2014  . Hypertension    Past Surgical History:  Procedure Laterality Date  . APPENDECTOMY    . CHOLECYSTECTOMY    . COLONOSCOPY N/A 03/26/2013   Procedure: COLONOSCOPY;  Surgeon: Rogene Houston, MD;  [facility];  Service: Endoscopy;  Laterality: N/A;  830  . COLONOSCOPY N/A 06/25/2018   Procedure: COLONOSCOPY;  Surgeon: Rogene Houston, MD;  [facility];  Service: Endoscopy;  Laterality: N/A;  200  . CYST EXCISION  1960 ?   spine     Current Outpatient Medications:  .  Cholecalciferol (D3 PO), Take by mouth., Disp: , Rfl:  .  Cyanocobalamin (B-12 PO), Take by mouth., Disp: , Rfl:  .  ipratropium (ATROVENT) 0.03 % nasal spray, Place 2 sprays into both nostrils 3 (three) times daily., Disp: , Rfl:  .  Multiple Vitamins-Minerals (MULTIVITAMINS THER. W/MINERALS) TABS, Take 1 tablet by mouth daily., Disp: , Rfl:  .  NIFEdipine (PROCARDIA-XL/ADALAT-CC/NIFEDICAL-XL) 30 MG 24 hr tablet, Take 30 mg by mouth 2 (two) times daily., Disp: , Rfl: 0 .  Omega-3 Fatty Acids (FISH OIL) 1000 MG CAPS, Take 1,000 mg by mouth daily., Disp: , Rfl:  .  zolpidem (AMBIEN) 10 MG tablet, Take 10 mg by mouth at bedtime as needed., Disp: , Rfl:   No Known Allergies Review of Systems Objective:  There were no vitals filed for this  visit.  General: Well developed, nourished, in no acute distress, alert and oriented x3   Dermatological: Skin is warm, dry and supple bilateral. Nails x 10 are thick yellow dystrophic-like mycotic painful palpation.  Remaining integument appears unremarkable at this time. There are no open sores, no preulcerative lesions, no rash or signs of infection present.  Vascular: Dorsalis Pedis artery and Posterior Tibial artery pedal pulses are 2/4 bilateral with immedate capillary fill time. Pedal hair growth present. No varicosities and no lower extremity edema present bilateral.   Neruologic: Grossly intact via light touch bilateral. Vibratory intact via tuning fork bilateral. Protective threshold with Semmes Wienstein monofilament intact to all pedal sites bilateral. Patellar and Achilles deep tendon reflexes 2+ bilateral. No Babinski or clonus noted bilateral.   Musculoskeletal: No gross boney pedal deformities bilateral. No pain, crepitus, or limitation noted with foot and ankle range of motion bilateral. Muscular strength 5/5 in all groups tested bilateral.  He demonstrates severe genu varum.  Cavus foot that wants to roll laterally when he stands.  No reproducible pain.  Gait: Unassisted, Nonantalgic.    Radiographs:  None taken  Assessment & Plan:   Assessment: Pain in limb secondary to onychomycosis.  Pes cavus with genu varum left.  Plan: Debrided nails 1 through 5 bilaterally.  Also recommended orthosis with a strong lateral flange  to wedge him up and flat his foot daily.  He has enough subtalar joint range of motion that he can get past neutral so he should be able to tolerate these well.     Gevork Ayyad T. [place], [place]

## 2020-03-21 ENCOUNTER — Other Ambulatory Visit: Payer: Medicare Other | Admitting: Orthotics

## 2020-05-12 DIAGNOSIS — Z08 Encounter for follow-up examination after completed treatment for malignant neoplasm: Secondary | ICD-10-CM | POA: Diagnosis not present

## 2020-05-12 DIAGNOSIS — C44212 Basal cell carcinoma of skin of right ear and external auricular canal: Secondary | ICD-10-CM | POA: Diagnosis not present

## 2020-05-12 DIAGNOSIS — X32XXXD Exposure to sunlight, subsequent encounter: Secondary | ICD-10-CM | POA: Diagnosis not present

## 2020-05-12 DIAGNOSIS — B078 Other viral warts: Secondary | ICD-10-CM | POA: Diagnosis not present

## 2020-05-12 DIAGNOSIS — L570 Actinic keratosis: Secondary | ICD-10-CM | POA: Diagnosis not present

## 2020-05-12 DIAGNOSIS — Z1283 Encounter for screening for malignant neoplasm of skin: Secondary | ICD-10-CM | POA: Diagnosis not present

## 2020-05-12 DIAGNOSIS — D2272 Melanocytic nevi of left lower limb, including hip: Secondary | ICD-10-CM | POA: Diagnosis not present

## 2020-05-12 DIAGNOSIS — Z85828 Personal history of other malignant neoplasm of skin: Secondary | ICD-10-CM | POA: Diagnosis not present

## 2020-05-12 DIAGNOSIS — D485 Neoplasm of uncertain behavior of skin: Secondary | ICD-10-CM | POA: Diagnosis not present

## 2020-05-12 DIAGNOSIS — D225 Melanocytic nevi of trunk: Secondary | ICD-10-CM | POA: Diagnosis not present

## 2020-05-13 DIAGNOSIS — Z1389 Encounter for screening for other disorder: Secondary | ICD-10-CM | POA: Diagnosis not present

## 2020-05-13 DIAGNOSIS — J309 Allergic rhinitis, unspecified: Secondary | ICD-10-CM | POA: Diagnosis not present

## 2020-05-13 DIAGNOSIS — G4700 Insomnia, unspecified: Secondary | ICD-10-CM | POA: Diagnosis not present

## 2020-05-13 DIAGNOSIS — E663 Overweight: Secondary | ICD-10-CM | POA: Diagnosis not present

## 2020-05-13 DIAGNOSIS — R7309 Other abnormal glucose: Secondary | ICD-10-CM | POA: Diagnosis not present

## 2020-05-13 DIAGNOSIS — Z6825 Body mass index (BMI) 25.0-25.9, adult: Secondary | ICD-10-CM | POA: Diagnosis not present

## 2020-05-30 DIAGNOSIS — L98499 Non-pressure chronic ulcer of skin of other sites with unspecified severity: Secondary | ICD-10-CM | POA: Diagnosis not present

## 2020-05-30 DIAGNOSIS — D485 Neoplasm of uncertain behavior of skin: Secondary | ICD-10-CM | POA: Diagnosis not present

## 2020-07-08 DIAGNOSIS — H43811 Vitreous degeneration, right eye: Secondary | ICD-10-CM | POA: Diagnosis not present

## 2020-07-19 ENCOUNTER — Other Ambulatory Visit: Payer: Medicare Other

## 2020-07-19 ENCOUNTER — Other Ambulatory Visit: Payer: Self-pay

## 2020-07-19 DIAGNOSIS — R972 Elevated prostate specific antigen [PSA]: Secondary | ICD-10-CM | POA: Diagnosis not present

## 2020-07-20 LAB — PSA, TOTAL AND FREE
PSA, Free Pct: 10.9 %
PSA, Free: 3.55 ng/mL
Prostate Specific Ag, Serum: 32.5 ng/mL — ABNORMAL HIGH (ref 0.0–4.0)

## 2020-07-21 NOTE — Progress Notes (Signed)
Subjective:  1. Prostate cancer ([place])   2. Elevated PSA   3. Urgency of urination       I have prostate cancer.     Wesley Reynolds returns today in f/u for his history of prostate cancer. He has intermediate risk disease on his biopsy on 03/17/18.  His PSA prior to this visit is up to 32.5 from  23.6 on 11/16/19 with the prior PSA of 22.9 on 08/07/19 which was stable over the last 3 months and in his usual range for the last 24 months.  He had an Axumin PET on 04/15/18 that showed only prostate uptake. His last biopsy was an MRI fusion biopsy on 03/17/18. He had a total 19 biopsied done. There was Gleason 6 in 30% of a single core from the ROI1 in the right TZ out of the 4 biopsies. The 3 biopsies from the left PZ ROI were negative. he had 2 cores on the left with 5% Gleason 6 and a single core at the right apex with Gleason 7(3+4) in 40%. His prostate is 62ml. On the MRI there was several sclerotic lesions in the pelvic bones that are suspicious for mets but that was not confirmed on the PET. He was biopsied because the PSA had jumped up to 27 but it fell back to 18 prior to the biopsy and has otherwise been stable for the last 2.5-3 years. He is voiding well but has nocturia 2-3x and has some time going back to sleep.  He has some urgency as well.  He has no bone pain or weight loss. He has gained some weight. His IPSS is 4. He has no associated signs or symptoms.      IPSS    Row Name 07/22/20 0900         International Prostate Symptom Score   How often have you had the sensation of not emptying your bladder? Less than 1 in 5     How often have you had to urinate less than every two hours? Not at All     How often have you found you stopped and started again several times when you urinated? Not at All     How often have you found it difficult to postpone urination? Less than 1 in 5 times     How often have you had a weak urinary stream? Not at All     How often have you had to strain to start  urination? Not at All     How many times did you typically get up at night to urinate? 2 Times     Total IPSS Score 4       Quality of Life due to urinary symptoms   If you were to spend the rest of your life with your urinary condition just the way it is now how would you feel about that? Pleased             ROS:  ROS:  A complete review of systems was performed.  All systems are negative except for pertinent findings as noted.   Review of Systems  All other systems reviewed and are negative.   No Known Allergies  Outpatient Encounter Medications as of 07/22/2020  Medication Sig  . Cholecalciferol (D3 PO) Take by mouth.  . Cyanocobalamin (B-12 PO) Take by mouth.  Marland Kitchen ipratropium (ATROVENT) 0.03 % nasal spray Place 2 sprays into both nostrils 3 (three) times daily.  . Multiple Vitamins-Minerals (MULTIVITAMINS THER. W/MINERALS) TABS  Take 1 tablet by mouth daily.  Marland Kitchen NIFEdipine (PROCARDIA-XL/ADALAT-CC/NIFEDICAL-XL) 30 MG 24 hr tablet Take 30 mg by mouth 2 (two) times daily.  . Omega-3 Fatty Acids (FISH OIL) 1000 MG CAPS Take 1,000 mg by mouth daily.  . QC PETROLEUM JELLY 100 % ointment APPLY TO AFFECTED AREASOAT BEDTIME.  . Salicylic Acid POWD Apply topically.  Marland Kitchen zolpidem (AMBIEN) 10 MG tablet Take 10 mg by mouth at bedtime as needed.   No facility-administered encounter medications on file as of 07/22/2020.    Past Medical History:  Diagnosis Date  . BPH (benign prostatic hyperplasia)   . GI bleed 11/2014  . Hypertension     Past Surgical History:  Procedure Laterality Date  . APPENDECTOMY    . CHOLECYSTECTOMY    . COLONOSCOPY N/A 03/26/2013   Procedure: COLONOSCOPY;  Surgeon: Rogene Houston, MD;  [facility];  Service: Endoscopy;  Laterality: N/A;  830  . COLONOSCOPY N/A 06/25/2018   Procedure: COLONOSCOPY;  Surgeon: Rogene Houston, MD;  [facility];  Service: Endoscopy;  Laterality: N/A;  200  . CYST EXCISION  1960 ?   spine     Social  History   Socioeconomic History  . Marital status: Married    Spouse name: Not on file  . Number of children: Not on file  . Years of education: Not on file  . Highest education level: Not on file  Occupational History  . Not on file  Tobacco Use  . Smoking status: Never Smoker  . Smokeless tobacco: Never Used  Vaping Use  . Vaping Use: Never used  Substance and Sexual Activity  . Alcohol use: No  . Drug use: Never  . Sexual activity: Not on file  Other Topics Concern  . Not on file  Social History Narrative  . Not on file   Social Determinants of Health   Financial Resource Strain:   . Difficulty of Paying Living Expenses: Not on file  Food Insecurity:   . Worried About Charity fundraiser in the Last Year: Not on file  . Ran Out of Food in the Last Year: Not on file  Transportation Needs:   . Lack of Transportation (Medical): Not on file  . Lack of Transportation (Non-Medical): Not on file  Physical Activity:   . Days of Exercise per Week: Not on file  . Minutes of Exercise per Session: Not on file  Stress:   . Feeling of Stress : Not on file  Social Connections:   . Frequency of Communication with Friends and Family: Not on file  . Frequency of Social Gatherings with Friends and Family: Not on file  . Attends Religious Services: Not on file  . Active Member of Clubs or Organizations: Not on file  . Attends Archivist Meetings: Not on file  . Marital Status: Not on file  Intimate Partner Violence:   . Fear of Current or Ex-Partner: Not on file  . Emotionally Abused: Not on file  . Physically Abused: Not on file  . Sexually Abused: Not on file    Family History  Problem Relation Age of Onset  . Cancer Brother        Objective: BP (!) 162/76   Pulse 98   Temp 98.7 F (37.1 C)   Ht 5\' 9"  (1.753 m)   Wt 166 lb (75.3 kg)   BMI 24.51 kg/m     Physical Exam Vitals reviewed.  Constitutional:  Appearance: Normal appearance.   Genitourinary:    Comments: Rectal Exam: NST. Prostate 2.5+ without nodules.   SV's non-palpable.  Lymphadenopathy:     Upper Body:     Right upper body: No supraclavicular or axillary adenopathy.     Left upper body: No supraclavicular or axillary adenopathy.  Neurological:     Mental Status: He is alert.     Lab Results:  No results found for this or any previous visit (from the past 24 hour(s)).  BMET No results for input(s): NA, K, CL, CO2, GLUCOSE, BUN, CREATININE, CALCIUM in the last 72 hours. PSA Recent Results (from the past 2160 hour(s))  PSA, total and free     Status: Abnormal   Collection Time: 07/19/20 10:56 AM  Result Value Ref Range   Prostate Specific Ag, Serum 32.5 (H) 0.0 - 4.0 ng/mL    Comment: Roche ECLIA methodology. According to the American Urological Association, Serum PSA should decrease and remain at undetectable levels after radical prostatectomy. The AUA defines biochemical recurrence as an initial PSA value 0.2 ng/mL or greater followed by a subsequent confirmatory PSA value 0.2 ng/mL or greater. Values obtained with different assay methods or kits cannot be used interchangeably. Results cannot be interpreted as absolute evidence of the presence or absence of malignant disease.    PSA, Free 3.55 N/A ng/mL    Comment: Roche ECLIA methodology.   PSA, Free Pct 10.9 %    Comment: The table below lists the probability of prostate cancer for men with non-suspicious DRE results and total PSA between 4 and 10 ng/mL, by patient age Ricci Barker, [place], [id number]).                   % Free PSA       50-64 yr        65-75 yr                   0.00-10.00%        56%             55%                  10.01-15.00%        24%             35%                  15.01-20.00%        17%             23%                  20.01-25.00%        10%             20%                       >25.00%         5%              9% Please note:  Catalona et al did not  make specific               recommendations regarding the use of               percent free PSA for any other population               of men.      PSA  Date Value Ref  Range Status  11/16/2019 23.6 (H) < OR = 4.0 ng/mL Final    Comment:    The total PSA value from this assay system is  standardized against the WHO standard. The test  result will be approximately 20% lower when compared  to the equimolar-standardized total PSA (Beckman  Coulter). Comparison of serial PSA results should be  interpreted with this fact in mind. . This test was performed using the Siemens  chemiluminescent method. Values obtained from  different assay methods cannot be used interchangeably. PSA levels, regardless of value, should not be interpreted as absolute evidence of the presence or absence of disease.    No results found for: TESTOSTERONE    Studies/Results: No results found.    Assessment & Plan: His PSA has jumped up but has been variable in the past.   His exam remains benign and he has no worrisome symptoms.   I am going to get him set up for a PSMA PET for further evaluation.   If there are no mets, I will have him return in 3 months with a PSA.  If he has documented mets, he will need androgen deprivation, possibly with RTx would be most appropriate.  He has stable nocturia and urgency but otherwise minimal LUTS so I don't think he is a candidate for finasteride.    No orders of the defined types were placed in this encounter.    Orders Placed This Encounter  Procedures  . NM PET ([id number]) SKULL TO MID THIGH    Standing Status:   Future    Standing Expiration Date:   08/22/2020    Scheduling Instructions:     I put [facility] but if it needs to be done at a different site, please schedule where appropriate.    Order Specific Question:   If indicated for the ordered procedure, I authorize the administration of a radiopharmaceutical per Radiology protocol    Answer:   Yes     Order Specific Question:   Preferred imaging location?    Answer:   [facility]  . Urinalysis, Routine w reflex microscopic  . PSA    Standing Status:   Future    Standing Expiration Date:   11/22/2020      Return in about 3 months (around 10/22/2020) for With PSA.   CC: Sharilyn Sites, MD      Irine Seal 07/22/2020

## 2020-07-22 ENCOUNTER — Other Ambulatory Visit: Payer: Self-pay

## 2020-07-22 ENCOUNTER — Ambulatory Visit: Payer: Medicare Other | Admitting: Urology

## 2020-07-22 ENCOUNTER — Encounter: Payer: Self-pay | Admitting: Urology

## 2020-07-22 ENCOUNTER — Ambulatory Visit (INDEPENDENT_AMBULATORY_CARE_PROVIDER_SITE_OTHER): Payer: Medicare Other | Admitting: Urology

## 2020-07-22 VITALS — BP 162/76 | HR 98 | Temp 98.7°F | Ht 69.0 in | Wt 166.0 lb

## 2020-07-22 DIAGNOSIS — R972 Elevated prostate specific antigen [PSA]: Secondary | ICD-10-CM

## 2020-07-22 DIAGNOSIS — C61 Malignant neoplasm of prostate: Secondary | ICD-10-CM

## 2020-07-22 DIAGNOSIS — R3915 Urgency of urination: Secondary | ICD-10-CM

## 2020-07-22 LAB — URINALYSIS, ROUTINE W REFLEX MICROSCOPIC
Bilirubin, UA: NEGATIVE
Ketones, UA: NEGATIVE
Leukocytes,UA: NEGATIVE
Nitrite, UA: NEGATIVE
Protein,UA: NEGATIVE
RBC, UA: NEGATIVE
Specific Gravity, UA: 1.025 (ref 1.005–1.030)
Urobilinogen, Ur: 0.2 mg/dL (ref 0.2–1.0)
pH, UA: 5.5 (ref 5.0–7.5)

## 2020-07-22 NOTE — Progress Notes (Signed)

## 2020-08-04 ENCOUNTER — Other Ambulatory Visit: Payer: Self-pay

## 2020-08-04 ENCOUNTER — Ambulatory Visit (HOSPITAL_COMMUNITY)
Admission: RE | Admit: 2020-08-04 | Discharge: 2020-08-04 | Disposition: A | Discharge status: Home / Self Care | Payer: Medicare Other | Source: Ambulatory Visit | Attending: Urology | Admitting: Urology

## 2020-08-04 DIAGNOSIS — C61 Malignant neoplasm of prostate: Secondary | ICD-10-CM | POA: Diagnosis not present

## 2020-08-04 DIAGNOSIS — N400 Enlarged prostate without lower urinary tract symptoms: Secondary | ICD-10-CM | POA: Diagnosis not present

## 2020-08-04 MED ORDER — PIFLIFOLASTAT F 18 (PYLARIFY) INJECTION
9.0000 | Freq: Once | INTRAVENOUS | Status: AC
  Administered 2020-08-04: 8 via INTRAVENOUS

## 2020-08-08 DIAGNOSIS — Z23 Encounter for immunization: Secondary | ICD-10-CM | POA: Diagnosis not present

## 2020-08-08 NOTE — Progress Notes (Signed)
Sent via mychart

## 2020-09-01 DIAGNOSIS — Z23 Encounter for immunization: Secondary | ICD-10-CM | POA: Diagnosis not present

## 2020-09-27 DIAGNOSIS — I10 Essential (primary) hypertension: Secondary | ICD-10-CM | POA: Diagnosis not present

## 2020-09-27 DIAGNOSIS — Z6825 Body mass index (BMI) 25.0-25.9, adult: Secondary | ICD-10-CM | POA: Diagnosis not present

## 2020-09-27 DIAGNOSIS — E119 Type 2 diabetes mellitus without complications: Secondary | ICD-10-CM | POA: Diagnosis not present

## 2020-09-27 DIAGNOSIS — G4700 Insomnia, unspecified: Secondary | ICD-10-CM | POA: Diagnosis not present

## 2020-10-13 ENCOUNTER — Other Ambulatory Visit: Payer: Medicare Other

## 2020-10-18 ENCOUNTER — Other Ambulatory Visit: Payer: Medicare Other

## 2020-10-18 DIAGNOSIS — C61 Malignant neoplasm of prostate: Secondary | ICD-10-CM | POA: Diagnosis not present

## 2020-10-19 ENCOUNTER — Other Ambulatory Visit: Payer: Self-pay

## 2020-10-19 NOTE — Progress Notes (Signed)
Subjective:  1. Prostate cancer ([facility])   2. Elevated PSA   3. Urgency of urination       I have prostate cancer.  10/20/20: Mr Wesley Reynolds returns today in f/u to discuss the results of the PSMA PET scan done on 08/04/20.  The study showed intense uptake in the prostate but no metastatic disease.   He is well with and IPSS of 5.  He had some numbness in the left arm last week when he leans up against a couch arm but it resolves when he lifts it up.  His weight is stable and he has no bone pain. He had bloodwork this week and I don't have the results.     07/22/20:  Mr. Walt returns today in f/u for his history of prostate cancer. He has intermediate risk disease on his biopsy on 03/17/18.  His PSA prior to this visit is up to 32.5 from  23.6 on 11/16/19 with the prior PSA of 22.9 on 08/07/19 which was stable over the last 3 months and in his usual range for the last 24 months.  He had an Axumin PET on 04/15/18 that showed only prostate uptake. His last biopsy was an MRI fusion biopsy on 03/17/18. He had a total 19 biopsied done. There was Gleason 6 in 30% of a single core from the ROI1 in the right TZ out of the 4 biopsies. The 3 biopsies from the left PZ ROI were negative. he had 2 cores on the left with 5% Gleason 6 and a single core at the right apex with Gleason 7(3+4) in 40%. His prostate is 94ml. On the MRI there was several sclerotic lesions in the pelvic bones that are suspicious for mets but that was not confirmed on the PET. He was biopsied because the PSA had jumped up to 27 but it fell back to 18 prior to the biopsy and has otherwise been stable for the last 2.5-3 years. He is voiding well but has nocturia 2-3x and has some time going back to sleep.  He has some urgency as well.  He has no bone pain or weight loss. He has gained some weight. His IPSS is 4. He has no associated signs or symptoms.      IPSS    Row Name 10/20/20 1000         International Prostate Symptom Score   How often have you  had the sensation of not emptying your bladder? Not at All     How often have you had to urinate less than every two hours? Not at All     How often have you found you stopped and started again several times when you urinated? Less than 1 in 5 times     How often have you found it difficult to postpone urination? Less than 1 in 5 times     How often have you had a weak urinary stream? Less than 1 in 5 times     How often have you had to strain to start urination? Not at All     How many times did you typically get up at night to urinate? 2 Times     Total IPSS Score 5           Quality of Life due to urinary symptoms   If you were to spend the rest of your life with your urinary condition just the way it is now how would you feel about that? Pleased  ROS:  ROS:  A complete review of systems was performed.  All systems are negative except for pertinent findings as noted.   Review of Systems  All other systems reviewed and are negative.   No Known Allergies  Outpatient Encounter Medications as of 10/20/2020  Medication Sig  . Cholecalciferol (D3 PO) Take by mouth.  . Cyanocobalamin (B-12 PO) Take by mouth.  Marland Kitchen ipratropium (ATROVENT) 0.03 % nasal spray Place 2 sprays into both nostrils 3 (three) times daily.  . Multiple Vitamins-Minerals (MULTIVITAMINS THER. W/MINERALS) TABS Take 1 tablet by mouth daily.  Marland Kitchen NIFEdipine (PROCARDIA-XL/ADALAT-CC/NIFEDICAL-XL) 30 MG 24 hr tablet Take 30 mg by mouth 2 (two) times daily.  . Omega-3 Fatty Acids (FISH OIL) 1000 MG CAPS Take 1,000 mg by mouth daily.  . QC PETROLEUM JELLY 100 % ointment APPLY TO AFFECTED AREASOAT BEDTIME.  . Salicylic Acid POWD Apply topically.  Marland Kitchen zolpidem (AMBIEN) 10 MG tablet Take 10 mg by mouth at bedtime as needed.   No facility-administered encounter medications on file as of 10/20/2020.    Past Medical History:  Diagnosis Date  . BPH (benign prostatic hyperplasia)   . GI bleed 11/2014  . Hypertension      Past Surgical History:  Procedure Laterality Date  . APPENDECTOMY    . CHOLECYSTECTOMY    . COLONOSCOPY N/A 03/26/2013   Procedure: COLONOSCOPY;  Surgeon: Malissa Hippo, MD;  [facility];  Service: Endoscopy;  Laterality: N/A;  830  . COLONOSCOPY N/A 06/25/2018   Procedure: COLONOSCOPY;  Surgeon: Malissa Hippo, MD;  [facility];  Service: Endoscopy;  Laterality: N/A;  200  . CYST EXCISION  1960 ?   spine     Social History   Socioeconomic History  . Marital status: Married    Spouse name: Not on file  . Number of children: Not on file  . Years of education: Not on file  . Highest education level: Not on file  Occupational History  . Not on file  Tobacco Use  . Smoking status: Never Smoker  . Smokeless tobacco: Never Used  Vaping Use  . Vaping Use: Never used  Substance and Sexual Activity  . Alcohol use: No  . Drug use: Never  . Sexual activity: Not on file  Other Topics Concern  . Not on file  Social History Narrative  . Not on file   Social Determinants of Health   Financial Resource Strain: Not on file  Food Insecurity: Not on file  Transportation Needs: Not on file  Physical Activity: Not on file  Stress: Not on file  Social Connections: Not on file  Intimate Partner Violence: Not on file    Family History  Problem Relation Age of Onset  . Cancer Brother        Objective: BP (!) 151/85   Pulse 91   Temp 98.5 F (36.9 C)   Ht 5\' 9"  (1.753 m)   Wt 165 lb (74.8 kg)   BMI 24.37 kg/m     Physical Exam  Lab Results:  No results found for this or any previous visit (from the past 24 hour(s)).  BMET No results for input(s): NA, K, CL, CO2, GLUCOSE, BUN, CREATININE, CALCIUM in the last 72 hours. PSA No results found for this or any previous visit (from the past 2160 hour(s)).   PSA  Date Value Ref Range Status  11/16/2019 23.6 (H) < OR = 4.0 ng/mL Final    Comment:  The total PSA value from this assay  system is  standardized against the WHO standard. The test  result will be approximately 20% lower when compared  to the equimolar-standardized total PSA (Beckman  Coulter). Comparison of serial PSA results should be  interpreted with this fact in mind. . This test was performed using the Siemens  chemiluminescent method. Values obtained from  different assay methods cannot be used interchangeably. PSA levels, regardless of value, should not be interpreted as absolute evidence of the presence or absence of disease.    Lab Results  Component Value Date   PSA1 32.5 (H) 07/19/2020     No results found for: TESTOSTERONE    Studies/Results: IMPRESSION: . PSMA PET  1. Intense focal activity within the prostate gland transitional zone consistent with prostate carcinoma. 2. No evidence of pelvic nodal metastasis or retroperitoneal nodal metastasis. 3. No evidence of visceral metastasis or skeletal metastasis. .  Assessment & Plan: Prostate cancer with a rising PSA but only local disease on the PSMA PET.   I will call him with the results of the recent labs and have him return in 3 months with a PSA.   He has stable nocturia and urgency but otherwise minimal LUTS so I don't think he is a candidate for finasteride.    No orders of the defined types were placed in this encounter.    Orders Placed This Encounter  Procedures  . Urinalysis, Routine w reflex microscopic  . PSA    Standing Status:   Future    Standing Expiration Date:   02/17/2021      Return in about 3 months (around 01/18/2021).   CC: Assunta Found, MD      Bjorn Pippin 10/20/2020

## 2020-10-20 ENCOUNTER — Other Ambulatory Visit: Payer: Self-pay

## 2020-10-20 ENCOUNTER — Ambulatory Visit (INDEPENDENT_AMBULATORY_CARE_PROVIDER_SITE_OTHER): Payer: Medicare Other | Admitting: Urology

## 2020-10-20 ENCOUNTER — Encounter: Payer: Self-pay | Admitting: Urology

## 2020-10-20 VITALS — BP 151/85 | HR 91 | Temp 98.5°F | Ht 69.0 in | Wt 165.0 lb

## 2020-10-20 DIAGNOSIS — C61 Malignant neoplasm of prostate: Secondary | ICD-10-CM | POA: Diagnosis not present

## 2020-10-20 DIAGNOSIS — R3915 Urgency of urination: Secondary | ICD-10-CM

## 2020-10-20 DIAGNOSIS — R972 Elevated prostate specific antigen [PSA]: Secondary | ICD-10-CM

## 2020-10-20 LAB — URINALYSIS, ROUTINE W REFLEX MICROSCOPIC
Bilirubin, UA: NEGATIVE
Glucose, UA: NEGATIVE
Leukocytes,UA: NEGATIVE
Nitrite, UA: NEGATIVE
Protein,UA: NEGATIVE
RBC, UA: NEGATIVE
Specific Gravity, UA: 1.02 (ref 1.005–1.030)
Urobilinogen, Ur: 0.2 mg/dL (ref 0.2–1.0)
pH, UA: 5.5 (ref 5.0–7.5)

## 2020-10-20 NOTE — Progress Notes (Signed)

## 2020-10-20 NOTE — Patient Instructions (Signed)
Hormone Suppression Therapy for Prostate Cancer    Hormone suppression therapy is a treatment that can help to slow the growth of cancer cells in the prostate (prostate gland). It is also called androgen deprivation therapy (ADT). Hormone suppression therapy targets hormones in the body called androgens that may help cancer cells grow. This therapy reduces the amount of these hormones in the body or keeps the body from using them.  Hormone suppression therapy alone will not cure prostate cancer, but it can slow the growth of prostate cancer and may shrink tumors over time. Your health care provider can help you find the most effective way to treat your type of prostate cancer and best fit your lifestyle.  Types of hormone suppression therapy  Orchiectomy  Orchiectomy, also called surgical castration, is a surgery to remove one or both testicles. The testicles are where the two main androgens (testosterone and dihydrotestosterone) are made.  Medicine therapy  Medicine therapy, also called chemical castration, involves taking medicines to keep your body from making or using androgens. If you choose this therapy, you may take any of these medicines:   Luteinizing hormone-releasing hormone (LHRH) agonist medicines. These medicines are injected or implanted under your skin to lower the amount of androgens that your testicles make. If you take these medicines, you may also be prescribed other medicines to help with side effects. Common LHRH agonist medicines include:  ? Leuprolide.  ? Goserelin.  ? Histrelin.  ? Triptorelin.   LHRH antagonist medicines. These medicines are like LHRH agonist medicines but they work faster. They are commonly used to prevent side effects when prostate cancer is in an advanced stage. A common LHRH antagonist medicine is degarelix.   CYP17 inhibitor medicines. These medicines help to stop other glands from making androgens. They may be used if the prostate cancer is advanced and has not  gotten better with surgery or other medicine treatment. A steroid medicine may be given with this type of medicine to help with side effects. A common CYP17 inhibitor medicine is abiraterone.    Anti-androgen medicines  Anti-androgen medicines block areas on the body where androgens attach. This prevents the androgens from coming into contact with cancer cells and fueling their growth. These medicines include:   Flutamide.   Bicalutamide.   Enzalutamide.   Nilutamide.  Androgen-suppressing medicines  Androgen-suppressing medicines may be used if other hormone suppression treatments are not working. They are not commonly used, however, because of their side effects. These medicines include:   Estrogen.   Ketoconazole.  What are the risks?  Hormone suppression therapy may cause side effects, including:   Hot flashes.   A decrease or lack of sexual desire.   Erectile dysfunction (impotence).   A decrease in the size of the penis or testicles.   Breast tenderness.   An increase in breast size.   Fatigue.   Weight gain.   Thinning of the bones (osteoporosis).   Anemia.   Loss of muscle.   Depression.   Increased cholesterol levels.   Trouble with thinking or focusing.   Stomach upset and nausea.   Diarrhea.  Hormone suppression therapy can also increase your risk of these problems:   High blood pressure (hypertension).   Stroke.   Diabetes.   Heart attack.   Heart disease.  What are the benefits?  One of the main benefits of hormone suppression therapy is having additional treatment options. You may have only one type of treatment or two or more types   side effects that do not get better  with treatment.  You have trouble urinating.  You have new side effects that do not go away. Get help right away if:  You have severe chest pain.  You have trouble breathing.  You have an irregular heartbeat.  You have numbness or paralysis in the lower half of your body.  You are confused.  You have trouble talking or understanding. These symptoms may be an emergency. Do not wait to see if the symptoms will go away. Get medical help right away. Call your local emergency services (911 in the [place] Do not drive yourself to the hospital. Summary  Hormone suppression therapy is a treatment that can help to slow the growth of cancer cells in the prostate (prostate gland).  Hormone suppression therapy alone will not cure prostate cancer, but it can slow the growth of prostate cancer and may shrink tumors over time.  Treatment to suppress hormones may include surgery or medicines. This information is not intended to replace advice given to you by your health care provider. Make sure you discuss any questions you have with your health care provider. Document Revised: 10/12/2016 Document Reviewed: 09/05/2016 Elsevier Patient Education  2020 [employer]vier Inc.  

## 2020-10-21 ENCOUNTER — Ambulatory Visit: Payer: Medicare Other | Admitting: Urology

## 2020-11-02 ENCOUNTER — Telehealth: Payer: Self-pay

## 2020-11-02 NOTE — Telephone Encounter (Signed)
-----   Message from Irine Seal, MD sent at 10/26/2020  7:38 AM EST ----- PSA stable at 32.   ----- Message ----- From: Dorisann Frames, RN Sent: 10/25/2020   4:58 PM EST To: Irine Seal, MD, [facility]  Outside labs for review

## 2020-11-02 NOTE — Telephone Encounter (Signed)
Pt called and notified of results.

## 2020-11-28 DIAGNOSIS — L570 Actinic keratosis: Secondary | ICD-10-CM | POA: Diagnosis not present

## 2020-11-28 DIAGNOSIS — X32XXXD Exposure to sunlight, subsequent encounter: Secondary | ICD-10-CM | POA: Diagnosis not present

## 2020-11-28 DIAGNOSIS — Z1283 Encounter for screening for malignant neoplasm of skin: Secondary | ICD-10-CM | POA: Diagnosis not present

## 2020-11-28 DIAGNOSIS — D225 Melanocytic nevi of trunk: Secondary | ICD-10-CM | POA: Diagnosis not present

## 2021-01-19 ENCOUNTER — Ambulatory Visit: Payer: Medicare Other | Admitting: Urology

## 2021-02-07 DIAGNOSIS — Z1331 Encounter for screening for depression: Secondary | ICD-10-CM | POA: Diagnosis not present

## 2021-02-07 DIAGNOSIS — E119 Type 2 diabetes mellitus without complications: Secondary | ICD-10-CM | POA: Diagnosis not present

## 2021-02-07 DIAGNOSIS — I10 Essential (primary) hypertension: Secondary | ICD-10-CM | POA: Diagnosis not present

## 2021-02-07 DIAGNOSIS — T39395S Adverse effect of other nonsteroidal anti-inflammatory drugs [NSAID], sequela: Secondary | ICD-10-CM | POA: Diagnosis not present

## 2021-02-07 DIAGNOSIS — Z1389 Encounter for screening for other disorder: Secondary | ICD-10-CM | POA: Diagnosis not present

## 2021-02-07 DIAGNOSIS — Z0001 Encounter for general adult medical examination with abnormal findings: Secondary | ICD-10-CM | POA: Diagnosis not present

## 2021-02-07 DIAGNOSIS — R7309 Other abnormal glucose: Secondary | ICD-10-CM | POA: Diagnosis not present

## 2021-02-07 DIAGNOSIS — M1991 Primary osteoarthritis, unspecified site: Secondary | ICD-10-CM | POA: Diagnosis not present

## 2021-02-07 DIAGNOSIS — I739 Peripheral vascular disease, unspecified: Secondary | ICD-10-CM | POA: Diagnosis not present

## 2021-02-07 DIAGNOSIS — R972 Elevated prostate specific antigen [PSA]: Secondary | ICD-10-CM | POA: Diagnosis not present

## 2021-02-07 DIAGNOSIS — N401 Enlarged prostate with lower urinary tract symptoms: Secondary | ICD-10-CM | POA: Diagnosis not present

## 2021-02-07 DIAGNOSIS — E782 Mixed hyperlipidemia: Secondary | ICD-10-CM | POA: Diagnosis not present

## 2021-02-07 DIAGNOSIS — Z6825 Body mass index (BMI) 25.0-25.9, adult: Secondary | ICD-10-CM | POA: Diagnosis not present

## 2021-02-07 DIAGNOSIS — E7849 Other hyperlipidemia: Secondary | ICD-10-CM | POA: Diagnosis not present

## 2021-02-20 DIAGNOSIS — L309 Dermatitis, unspecified: Secondary | ICD-10-CM | POA: Diagnosis not present

## 2021-02-20 DIAGNOSIS — Z6825 Body mass index (BMI) 25.0-25.9, adult: Secondary | ICD-10-CM | POA: Diagnosis not present

## 2021-02-20 DIAGNOSIS — E663 Overweight: Secondary | ICD-10-CM | POA: Diagnosis not present

## 2021-03-07 ENCOUNTER — Other Ambulatory Visit: Payer: Medicare Other

## 2021-03-07 ENCOUNTER — Other Ambulatory Visit: Payer: Self-pay

## 2021-03-07 DIAGNOSIS — R972 Elevated prostate specific antigen [PSA]: Secondary | ICD-10-CM | POA: Diagnosis not present

## 2021-03-08 LAB — PSA: Prostate Specific Ag, Serum: 34 ng/mL — ABNORMAL HIGH (ref 0.0–4.0)

## 2021-03-09 ENCOUNTER — Ambulatory Visit (INDEPENDENT_AMBULATORY_CARE_PROVIDER_SITE_OTHER): Payer: Medicare Other | Admitting: Urology

## 2021-03-09 ENCOUNTER — Encounter: Payer: Self-pay | Admitting: Urology

## 2021-03-09 ENCOUNTER — Other Ambulatory Visit: Payer: Self-pay

## 2021-03-09 VITALS — BP 164/87 | HR 69 | Temp 97.9°F | Ht 69.0 in | Wt 162.0 lb

## 2021-03-09 DIAGNOSIS — R351 Nocturia: Secondary | ICD-10-CM

## 2021-03-09 DIAGNOSIS — N401 Enlarged prostate with lower urinary tract symptoms: Secondary | ICD-10-CM

## 2021-03-09 DIAGNOSIS — R972 Elevated prostate specific antigen [PSA]: Secondary | ICD-10-CM | POA: Diagnosis not present

## 2021-03-09 DIAGNOSIS — C61 Malignant neoplasm of prostate: Secondary | ICD-10-CM | POA: Diagnosis not present

## 2021-03-09 DIAGNOSIS — N138 Other obstructive and reflux uropathy: Secondary | ICD-10-CM | POA: Diagnosis not present

## 2021-03-09 LAB — URINALYSIS, ROUTINE W REFLEX MICROSCOPIC
Bilirubin, UA: NEGATIVE
Leukocytes,UA: NEGATIVE
Nitrite, UA: NEGATIVE
Protein,UA: NEGATIVE
RBC, UA: NEGATIVE
Specific Gravity, UA: >1.03 — ABNORMAL HIGH (ref 1.005–1.030)
Urobilinogen, Ur: 0.2 mg/dL (ref 0.2–1.0)
pH, UA: 5.5 (ref 5.0–7.5)

## 2021-03-09 MED ORDER — FINASTERIDE 5 MG PO TABS
5.0000 mg | ORAL_TABLET | Freq: Every day | ORAL | 3 refills | Status: DC
Start: 2021-03-09 — End: 2021-06-08

## 2021-03-09 NOTE — Progress Notes (Signed)
Subjective:  1. Prostate cancer ([place])   2. Elevated PSA   3. BPH with urinary obstruction   4. Nocturia       I have prostate cancer.  03/09/21: Wesley Reynolds returns today for his history of locally progressive prostate cancer.  His PSA continues to rise but relatively slowly and is up to 34 from 32.5 in 10/21.  The PSMA PET in 10/21 only showed local uptake.  He has mild LUTS with nocturia x 2.  His UA is clear. His IPSS is 4.   10/20/20: Wesley Reynolds returns today in f/u to discuss the results of the PSMA PET scan done on 08/04/20.  The study showed intense uptake in the prostate but no metastatic disease.   He is well with and IPSS of 5.  He had some numbness in the left arm last week when he leans up against a couch arm but it resolves when he lifts it up.  His weight is stable and he has no bone pain. He had bloodwork this week and I don't have the results.     07/22/20:  Wesley Reynolds returns today in f/u for his history of prostate cancer. He has intermediate risk disease on his biopsy on 03/17/18.  His PSA prior to this visit is up to 32.5 from  23.6 on 11/16/19 with the prior PSA of 22.9 on 08/07/19 which was stable over the last 3 months and in his usual range for the last 24 months.  He had an Axumin PET on 04/15/18 that showed only prostate uptake. His last biopsy was an MRI fusion biopsy on 03/17/18. He had a total 19 biopsied done. There was Gleason 6 in 30% of a single core from the ROI1 in the right TZ out of the 4 biopsies. The 3 biopsies from the left PZ ROI were negative. he had 2 cores on the left with 5% Gleason 6 and a single core at the right apex with Gleason 7(3+4) in 40%. His prostate is 58ml. On the MRI there was several sclerotic lesions in the pelvic bones that are suspicious for mets but that was not confirmed on the PET. He was biopsied because the PSA had jumped up to 27 but it fell back to 18 prior to the biopsy and has otherwise been stable for the last 2.5-3 years. He is voiding well  but has nocturia 2-3x and has some time going back to sleep.  He has some urgency as well.  He has no bone pain or weight loss. He has gained some weight. His IPSS is 4. He has no associated signs or symptoms.      IPSS    Row Name 03/09/21 1400         International Prostate Symptom Score   How often have you had the sensation of not emptying your bladder? Not at All     How often have you had to urinate less than every two hours? Less than 1 in 5 times     How often have you found you stopped and started again several times when you urinated? Not at All     How often have you found it difficult to postpone urination? Less than 1 in 5 times     How often have you had a weak urinary stream? Not at All     How often have you had to strain to start urination? Not at All     How many times did you typically get  up at night to urinate? 2 Times     Total IPSS Score 4           Quality of Life due to urinary symptoms   If you were to spend the rest of your life with your urinary condition just the way it is now how would you feel about that? Pleased             ROS:  ROS:  A complete review of systems was performed.  All systems are negative except for pertinent findings as noted.   Review of Systems  All other systems reviewed and are negative.   No Known Allergies  Outpatient Encounter Medications as of 03/09/2021  Medication Sig  . Cholecalciferol (D3 PO) Take by mouth.  . Cyanocobalamin (B-12 PO) Take by mouth.  . finasteride (PROSCAR) 5 MG tablet Take 1 tablet (5 mg total) by mouth daily.  Marland Kitchen ipratropium (ATROVENT) 0.03 % nasal spray Place 2 sprays into both nostrils 3 (three) times daily.  . Multiple Vitamins-Minerals (MULTIVITAMINS THER. W/MINERALS) TABS Take 1 tablet by mouth daily.  Marland Kitchen NIFEdipine (PROCARDIA-XL/ADALAT-CC/NIFEDICAL-XL) 30 MG 24 hr tablet Take 30 mg by mouth 2 (two) times daily.  . Omega-3 Fatty Acids (FISH OIL) 1000 MG CAPS Take 1,000 mg by mouth daily.  .  QC PETROLEUM JELLY 100 % ointment APPLY TO AFFECTED AREASOAT BEDTIME.  . Salicylic Acid POWD Apply topically.  Marland Kitchen zolpidem (AMBIEN) 10 MG tablet Take 10 mg by mouth at bedtime as needed.  . triamcinolone cream (KENALOG) 0.1 % Apply topically.   No facility-administered encounter medications on file as of 03/09/2021.    Past Medical History:  Diagnosis Date  . BPH (benign prostatic hyperplasia)   . GI bleed 11/2014  . Hypertension     Past Surgical History:  Procedure Laterality Date  . APPENDECTOMY    . CHOLECYSTECTOMY    . COLONOSCOPY N/A 03/26/2013   Procedure: COLONOSCOPY;  Surgeon: Rogene Houston, MD;  [facility];  Service: Endoscopy;  Laterality: N/A;  830  . COLONOSCOPY N/A 06/25/2018   Procedure: COLONOSCOPY;  Surgeon: Rogene Houston, MD;  [facility]: [facility];  Service: Endoscopy;  Laterality: N/A;  200  . CYST EXCISION  1960 ?   spine     Social History   Socioeconomic History  . Marital status: Married    Spouse name: Not on file  . Number of children: Not on file  . Years of education: Not on file  . Highest education level: Not on file  Occupational History  . Not on file  Tobacco Use  . Smoking status: Never Smoker  . Smokeless tobacco: Never Used  Vaping Use  . Vaping Use: Never used  Substance and Sexual Activity  . Alcohol use: No  . Drug use: Never  . Sexual activity: Not on file  Other Topics Concern  . Not on file  Social History Narrative  . Not on file   Social Determinants of Health   Financial Resource Strain: Not on file  Food Insecurity: Not on file  Transportation Needs: Not on file  Physical Activity: Not on file  Stress: Not on file  Social Connections: Not on file  Intimate Partner Violence: Not on file    Family History  Problem Relation Age of Onset  . Cancer Brother        Objective: BP (!) 164/87   Pulse 69   Temp 97.9 F (36.6 C) (Oral)   Ht 5'  9" (1.753 m)   Wt 162 lb (73.5 kg)   BMI 23.92  kg/m     Physical Exam  Lab Results:  No results found for this or any previous visit (from the past 24 hour(s)).  BMET No results for input(s): NA, K, CL, CO2, GLUCOSE, BUN, CREATININE, CALCIUM in the last 72 hours. PSA Recent Results (from the past 2160 hour(s))  PSA     Status: Abnormal   Collection Time: 03/07/21 11:34 AM  Result Value Ref Range   Prostate Specific Ag, Serum 34.0 (H) 0.0 - 4.0 ng/mL    Comment: Roche ECLIA methodology. According to the American Urological Association, Serum PSA should decrease and remain at undetectable levels after radical prostatectomy. The AUA defines biochemical recurrence as an initial PSA value 0.2 ng/mL or greater followed by a subsequent confirmatory PSA value 0.2 ng/mL or greater. Values obtained with different assay methods or kits cannot be used interchangeably. Results cannot be interpreted as absolute evidence of the presence or absence of malignant disease.      PSA  Date Value Ref Range Status  11/16/2019 23.6 (H) < OR = 4.0 ng/mL Final    Comment:    The total PSA value from this assay system is  standardized against the WHO standard. The test  result will be approximately 20% lower when compared  to the equimolar-standardized total PSA (Beckman  Coulter). Comparison of serial PSA results should be  interpreted with this fact in mind. . This test was performed using the Siemens  chemiluminescent method. Values obtained from  different assay methods cannot be used interchangeably. PSA levels, regardless of value, should not be interpreted as absolute evidence of the presence or absence of disease.    Lab Results  Component Value Date   PSA1 34.0 (H) 03/07/2021   PSA1 32.5 (H) 07/19/2020    Results for orders placed or performed in visit on 03/09/21 (from the past 24 hour(s))  Urinalysis, Routine w reflex microscopic     Status: Abnormal   Collection Time: 03/09/21  2:19 PM  Result Value Ref Range    Specific Gravity, UA >1.030 (H) 1.005 - 1.030   pH, UA 5.5 5.0 - 7.5   Color, UA Yellow Yellow   Appearance Ur Clear Clear   Leukocytes,UA Negative Negative   Protein,UA Negative Negative/Trace   Glucose, UA Trace (A) Negative   Ketones, UA Trace (A) Negative   RBC, UA Negative Negative   Bilirubin, UA Negative Negative   Urobilinogen, Ur 0.2 0.2 - 1.0 mg/dL   Nitrite, UA Negative Negative   Microscopic Examination Comment    Narrative   Performed at:  [facility] [address] [place], [place]  [id number] Lab Director: Mina Marble MT, Phone:  [phone]     No results found for: TESTOSTERONE    Studies/Results: IMPRESSION: . PSMA PET  1. Intense focal activity within the prostate gland transitional zone consistent with prostate carcinoma. 2. No evidence of pelvic nodal metastasis or retroperitoneal nodal metastasis. 3. No evidence of visceral metastasis or skeletal metastasis. .  Assessment & Plan: Prostate cancer with a rising PSA but only local disease on the PSMA PET.   I discussed options including continued observation, finasteride, XRT or ADT and will try finasteride as an off label option to see if we can forstall progression of the tumor.   He has stable nocturia and urgency but otherwise minimal LUTS.  I discussed the pros and cons of finasteride therapy and  we will give that a try.   Side effects reviewed in detail.     Meds ordered this encounter  Medications  . finasteride (PROSCAR) 5 MG tablet    Sig: Take 1 tablet (5 mg total) by mouth daily.    Dispense:  90 tablet    Refill:  3     Orders Placed This Encounter  Procedures  . Urinalysis, Routine w reflex microscopic      Return in about 3 months (around 06/09/2021) for with PSA.   CC: Sharilyn Sites, MD      Irine Seal 03/09/2021 Patient ID: Wesley Reynolds, male   DOB: [birth year], 87 y.o.   MRN: [id number]

## 2021-03-09 NOTE — Progress Notes (Signed)
Urological Symptom Review  Patient is experiencing the following symptoms: Get up at night to urinate   Review of Systems  Gastrointestinal (upper)  : None  Gastrointestinal (lower) : none  Constitutional : Negative for symptoms  Skin: negative  Eyes: Negative for eye symptoms  Ear/Nose/Throat : Negative for Ear/Nose/Throat symptoms  Hematologic/Lymphatic: Negative for Hematologic/Lymphatic symptoms  Cardiovascular : Negative for cardiovascular symptoms  Respiratory : Negative for respiratory symptoms  Endocrine: Negative for endocrine symptoms  Musculoskeletal: Negative for musculoskeletal symptoms  Neurological: Negative for neurological symptoms  Psychologic: Negative for psychiatric symptoms

## 2021-03-27 DIAGNOSIS — J302 Other seasonal allergic rhinitis: Secondary | ICD-10-CM | POA: Diagnosis not present

## 2021-03-27 DIAGNOSIS — Z6825 Body mass index (BMI) 25.0-25.9, adult: Secondary | ICD-10-CM | POA: Diagnosis not present

## 2021-03-27 DIAGNOSIS — S39012A Strain of muscle, fascia and tendon of lower back, initial encounter: Secondary | ICD-10-CM | POA: Diagnosis not present

## 2021-03-27 DIAGNOSIS — G4700 Insomnia, unspecified: Secondary | ICD-10-CM | POA: Diagnosis not present

## 2021-03-27 DIAGNOSIS — E663 Overweight: Secondary | ICD-10-CM | POA: Diagnosis not present

## 2021-04-03 DIAGNOSIS — M47816 Spondylosis without myelopathy or radiculopathy, lumbar region: Secondary | ICD-10-CM | POA: Diagnosis not present

## 2021-04-03 DIAGNOSIS — S338XXA Sprain of other parts of lumbar spine and pelvis, initial encounter: Secondary | ICD-10-CM | POA: Diagnosis not present

## 2021-04-03 DIAGNOSIS — M9903 Segmental and somatic dysfunction of lumbar region: Secondary | ICD-10-CM | POA: Diagnosis not present

## 2021-04-05 DIAGNOSIS — S338XXA Sprain of other parts of lumbar spine and pelvis, initial encounter: Secondary | ICD-10-CM | POA: Diagnosis not present

## 2021-04-05 DIAGNOSIS — M47816 Spondylosis without myelopathy or radiculopathy, lumbar region: Secondary | ICD-10-CM | POA: Diagnosis not present

## 2021-04-05 DIAGNOSIS — M9903 Segmental and somatic dysfunction of lumbar region: Secondary | ICD-10-CM | POA: Diagnosis not present

## 2021-06-01 ENCOUNTER — Other Ambulatory Visit: Payer: Medicare Other

## 2021-06-01 ENCOUNTER — Other Ambulatory Visit: Payer: Self-pay

## 2021-06-01 DIAGNOSIS — C61 Malignant neoplasm of prostate: Secondary | ICD-10-CM | POA: Diagnosis not present

## 2021-06-01 DIAGNOSIS — R972 Elevated prostate specific antigen [PSA]: Secondary | ICD-10-CM | POA: Diagnosis not present

## 2021-06-02 LAB — PSA: Prostate Specific Ag, Serum: 35.9 ng/mL — ABNORMAL HIGH (ref 0.0–4.0)

## 2021-06-08 ENCOUNTER — Encounter: Payer: Self-pay | Admitting: Urology

## 2021-06-08 ENCOUNTER — Ambulatory Visit (INDEPENDENT_AMBULATORY_CARE_PROVIDER_SITE_OTHER): Payer: Medicare Other | Admitting: Urology

## 2021-06-08 ENCOUNTER — Other Ambulatory Visit: Payer: Self-pay

## 2021-06-08 VITALS — BP 135/74 | HR 58

## 2021-06-08 DIAGNOSIS — C61 Malignant neoplasm of prostate: Secondary | ICD-10-CM | POA: Diagnosis not present

## 2021-06-08 DIAGNOSIS — R351 Nocturia: Secondary | ICD-10-CM

## 2021-06-08 DIAGNOSIS — R3915 Urgency of urination: Secondary | ICD-10-CM

## 2021-06-08 DIAGNOSIS — R972 Elevated prostate specific antigen [PSA]: Secondary | ICD-10-CM

## 2021-06-08 DIAGNOSIS — N138 Other obstructive and reflux uropathy: Secondary | ICD-10-CM | POA: Diagnosis not present

## 2021-06-08 DIAGNOSIS — N401 Enlarged prostate with lower urinary tract symptoms: Secondary | ICD-10-CM

## 2021-06-08 NOTE — Progress Notes (Signed)

## 2021-06-08 NOTE — Progress Notes (Signed)
Subjective:  1. Prostate cancer ([place])   2. Elevated PSA   3. BPH with urinary obstruction   4. Nocturia   5. Urgency of urination       I have prostate cancer.  06/08/21: Wesley Reynolds returns today in f/u for the history noted below.   He was given finasteride at his last visit but never started it.  His PSA continues to rise slowly and was 35.9 on 06/01/21 from 34 on 03/07/21.  His has stable LUTS and his IPSS is 5 with nocturia x 2.   03/09/21: Wesley. Reynolds returns today for his history of locally progressive prostate cancer.  His PSA continues to rise but relatively slowly and is up to 34 from 32.5 in 10/21.  The PSMA PET in 10/21 only showed local uptake.  He has mild LUTS with nocturia x 2.  His UA is clear. His IPSS is 4.   10/20/20: Wesley Reynolds returns today in f/u to discuss the results of the PSMA PET scan done on 08/04/20.  The study showed intense uptake in the prostate but no metastatic disease.   He is well with and IPSS of 5.  He had some numbness in the left arm last week when he leans up against a couch arm but it resolves when he lifts it up.  His weight is stable and he has no bone pain. He had bloodwork this week and I don't have the results.     07/22/20:  Wesley. Reynolds returns today in f/u for his history of prostate cancer. He has intermediate risk disease on his biopsy on 03/17/18.  His PSA prior to this visit is up to 32.5 from  23.6 on 11/16/19 with the prior PSA of 22.9 on 08/07/19 which was stable over the last 3 months and in his usual range for the last 24 months.  He had an Axumin PET on 04/15/18 that showed only prostate uptake. His last biopsy was an MRI fusion biopsy on 03/17/18. He had a total 19 biopsied done. There was Gleason 6 in 30% of a single core from the ROI1 in the right TZ out of the 4 biopsies. The 3 biopsies from the left PZ ROI were negative. he had 2 cores on the left with 5% Gleason 6 and a single core at the right apex with Gleason 7(3+4) in 40%. His prostate is 14m. On  the MRI there was several sclerotic lesions in the pelvic bones that are suspicious for mets but that was not confirmed on the PET. He was biopsied because the PSA had jumped up to 27 but it fell back to 18 prior to the biopsy and has otherwise been stable for the last 2.5-3 years. He is voiding well but has nocturia 2-3x and has some time going back to sleep.  He has some urgency as well.  He has no bone pain or weight loss. He has gained some weight. His IPSS is 4. He has no associated signs or symptoms.      IPSS     Row Name 06/08/21 1400         International Prostate Symptom Score   How often have you had the sensation of not emptying your bladder? Less than 1 in 5     How often have you had to urinate less than every two hours? Not at All     How often have you found you stopped and started again several times when you urinated? Less than 1  in 5 times     How often have you found it difficult to postpone urination? Less than 1 in 5 times     How often have you had a weak urinary stream? Not at All     How often have you had to strain to start urination? Not at All     How many times did you typically get up at night to urinate? 2 Times     Total IPSS Score 5           Quality of Life due to urinary symptoms   If you were to spend the rest of your life with your urinary condition just the way it is now how would you feel about that? Pleased                ROS:  ROS:  A complete review of systems was performed.  All systems are negative except for pertinent findings as noted.   Review of Systems  All other systems reviewed and are negative.  No Known Allergies  Outpatient Encounter Medications as of 06/08/2021  Medication Sig   Cholecalciferol (D3 PO) Take by mouth.   Cyanocobalamin (B-12 PO) Take by mouth.   ipratropium (ATROVENT) 0.03 % nasal spray Place 2 sprays into both nostrils 3 (three) times daily.   Multiple Vitamins-Minerals (MULTIVITAMINS THER. W/MINERALS)  TABS Take 1 tablet by mouth daily.   NIFEdipine (PROCARDIA-XL/ADALAT-CC/NIFEDICAL-XL) 30 MG 24 hr tablet Take 30 mg by mouth 2 (two) times daily.   Omega-3 Fatty Acids (FISH OIL) 1000 MG CAPS Take 1,000 mg by mouth daily.   QC PETROLEUM JELLY 100 % ointment APPLY TO AFFECTED AREASOAT BEDTIME.   Salicylic Acid POWD Apply topically.   triamcinolone cream (KENALOG) 0.1 % Apply topically.   zolpidem (AMBIEN) 10 MG tablet Take 10 mg by mouth at bedtime as needed.   [DISCONTINUED] finasteride (PROSCAR) 5 MG tablet Take 1 tablet (5 mg total) by mouth daily.   No facility-administered encounter medications on file as of 06/08/2021.    Past Medical History:  Diagnosis Date   BPH (benign prostatic hyperplasia)    GI bleed 11/2014   Hypertension     Past Surgical History:  Procedure Laterality Date   APPENDECTOMY     CHOLECYSTECTOMY     COLONOSCOPY N/A 03/26/2013   Procedure: COLONOSCOPY;  Surgeon: Rogene Houston, MD;  [facility];  Service: Endoscopy;  Laterality: N/A;  830   COLONOSCOPY N/A 06/25/2018   Procedure: COLONOSCOPY;  Surgeon: Rogene Houston, MD;  [facility];  Service: Endoscopy;  Laterality: N/A;  200   CYST EXCISION  1960 ?   spine     Social History   Socioeconomic History   Marital status: Married    Spouse name: Not on file   Number of children: Not on file   Years of education: Not on file   Highest education level: Not on file  Occupational History   Not on file  Tobacco Use   Smoking status: Never   Smokeless tobacco: Never  Vaping Use   Vaping Use: Never used  Substance and Sexual Activity   Alcohol use: No   Drug use: Never   Sexual activity: Not on file  Other Topics Concern   Not on file  Social History Narrative   Not on file   Social Determinants of Health   Financial Resource Strain: Not on file  Food Insecurity: Not on file  Transportation Needs: Not  on file  Physical Activity: Not on file  Stress: Not on file   Social Connections: Not on file  Intimate Partner Violence: Not on file    Family History  Problem Relation Age of Onset   Cancer Brother        Objective: BP 135/74   Pulse (!) 58     Physical Exam  Lab Results:  No results found for this or any previous visit (from the past 24 hour(s)).  BMET No results for input(s): NA, K, CL, CO2, GLUCOSE, BUN, CREATININE, CALCIUM in the last 72 hours. PSA Recent Results (from the past 2160 hour(s))  PSA     Status: Abnormal   Collection Time: 06/01/21  9:14 AM  Result Value Ref Range   Prostate Specific Ag, Serum 35.9 (H) 0.0 - 4.0 ng/mL    Comment: Roche ECLIA methodology. According to the American Urological Association, Serum PSA should decrease and remain at undetectable levels after radical prostatectomy. The AUA defines biochemical recurrence as an initial PSA value 0.2 ng/mL or greater followed by a subsequent confirmatory PSA value 0.2 ng/mL or greater. Values obtained with different assay methods or kits cannot be used interchangeably. Results cannot be interpreted as absolute evidence of the presence or absence of malignant disease.      PSA  Date Value Ref Range Status  11/16/2019 23.6 (H) < OR = 4.0 ng/mL Final    Comment:    The total PSA value from this assay system is  standardized against the WHO standard. The test  result will be approximately 20% lower when compared  to the equimolar-standardized total PSA (Beckman  Coulter). Comparison of serial PSA results should be  interpreted with this fact in mind. . This test was performed using the Siemens  chemiluminescent method. Values obtained from  different assay methods cannot be used interchangeably. PSA levels, regardless of value, should not be interpreted as absolute evidence of the presence or absence of disease.    Lab Results  Component Value Date   PSA1 35.9 (H) 06/01/2021   PSA1 34.0 (H) 03/07/2021   PSA1 32.5 (H) 07/19/2020    No  results found for this or any previous visit (from the past 24 hour(s)).    No results found for: TESTOSTERONE    Studies/Results: 08/04/20: PSMA PET   1. Intense focal activity within the prostate gland transitional zone consistent with prostate carcinoma. 2. No evidence of pelvic nodal metastasis or retroperitoneal nodal metastasis. 3. No evidence of visceral metastasis or skeletal metastasis. .   Assessment & Plan: Prostate cancer with a rising PSA but only local disease on the PSMA PET.   He never got the finasteride.   I discussed options including continued observation, finasteride, XRT or ADT and he would like to continue observation.  I will have him return in 3 months with a PSA.   He has stable nocturia and urgency but otherwise minimal LUTS.  I discussed the pros and cons of finasteride therapy  but he doesn't have enough bother to want it at this time.     No orders of the defined types were placed in this encounter.    Orders Placed This Encounter  Procedures   Urinalysis, Routine w reflex microscopic   PSA    Standing Status:   Future    Standing Expiration Date:   12/09/2021      Return in about 3 months (around 09/08/2021) for with PSA.   CC: Sharilyn Sites, MD  Irine Seal 06/08/2021 Patient ID: Wesley Reynolds, male   DOB: 15-Feb-1931, 87 y.o.   MRN: [id number]

## 2021-07-20 DIAGNOSIS — Z23 Encounter for immunization: Secondary | ICD-10-CM | POA: Diagnosis not present

## 2021-08-17 DIAGNOSIS — E782 Mixed hyperlipidemia: Secondary | ICD-10-CM | POA: Diagnosis not present

## 2021-08-17 DIAGNOSIS — E663 Overweight: Secondary | ICD-10-CM | POA: Diagnosis not present

## 2021-08-17 DIAGNOSIS — Z6823 Body mass index (BMI) 23.0-23.9, adult: Secondary | ICD-10-CM | POA: Diagnosis not present

## 2021-08-17 DIAGNOSIS — R7309 Other abnormal glucose: Secondary | ICD-10-CM | POA: Diagnosis not present

## 2021-08-17 DIAGNOSIS — G4700 Insomnia, unspecified: Secondary | ICD-10-CM | POA: Diagnosis not present

## 2021-08-17 DIAGNOSIS — J302 Other seasonal allergic rhinitis: Secondary | ICD-10-CM | POA: Diagnosis not present

## 2021-08-17 DIAGNOSIS — E7849 Other hyperlipidemia: Secondary | ICD-10-CM | POA: Diagnosis not present

## 2021-08-17 DIAGNOSIS — T39395S Adverse effect of other nonsteroidal anti-inflammatory drugs [NSAID], sequela: Secondary | ICD-10-CM | POA: Diagnosis not present

## 2021-08-17 DIAGNOSIS — M1991 Primary osteoarthritis, unspecified site: Secondary | ICD-10-CM | POA: Diagnosis not present

## 2021-08-17 DIAGNOSIS — I10 Essential (primary) hypertension: Secondary | ICD-10-CM | POA: Diagnosis not present

## 2021-08-17 DIAGNOSIS — I739 Peripheral vascular disease, unspecified: Secondary | ICD-10-CM | POA: Diagnosis not present

## 2021-08-17 DIAGNOSIS — N401 Enlarged prostate with lower urinary tract symptoms: Secondary | ICD-10-CM | POA: Diagnosis not present

## 2021-08-17 DIAGNOSIS — E119 Type 2 diabetes mellitus without complications: Secondary | ICD-10-CM | POA: Diagnosis not present

## 2021-08-23 DIAGNOSIS — H25813 Combined forms of age-related cataract, bilateral: Secondary | ICD-10-CM | POA: Diagnosis not present

## 2021-09-04 ENCOUNTER — Other Ambulatory Visit: Payer: Medicare Other

## 2021-09-04 DIAGNOSIS — E663 Overweight: Secondary | ICD-10-CM | POA: Diagnosis not present

## 2021-09-04 DIAGNOSIS — Z6824 Body mass index (BMI) 24.0-24.9, adult: Secondary | ICD-10-CM | POA: Diagnosis not present

## 2021-09-04 DIAGNOSIS — J22 Unspecified acute lower respiratory infection: Secondary | ICD-10-CM | POA: Diagnosis not present

## 2021-09-06 ENCOUNTER — Other Ambulatory Visit: Payer: Self-pay

## 2021-09-06 ENCOUNTER — Other Ambulatory Visit: Payer: Medicare Other

## 2021-09-06 DIAGNOSIS — C61 Malignant neoplasm of prostate: Secondary | ICD-10-CM | POA: Diagnosis not present

## 2021-09-06 DIAGNOSIS — R972 Elevated prostate specific antigen [PSA]: Secondary | ICD-10-CM

## 2021-09-07 LAB — PSA: Prostate Specific Ag, Serum: 36.7 ng/mL — ABNORMAL HIGH (ref 0.0–4.0)

## 2021-09-12 DIAGNOSIS — J329 Chronic sinusitis, unspecified: Secondary | ICD-10-CM | POA: Diagnosis not present

## 2021-09-12 DIAGNOSIS — I739 Peripheral vascular disease, unspecified: Secondary | ICD-10-CM | POA: Diagnosis not present

## 2021-09-12 DIAGNOSIS — Z6824 Body mass index (BMI) 24.0-24.9, adult: Secondary | ICD-10-CM | POA: Diagnosis not present

## 2021-09-12 DIAGNOSIS — I10 Essential (primary) hypertension: Secondary | ICD-10-CM | POA: Diagnosis not present

## 2021-09-12 DIAGNOSIS — M1991 Primary osteoarthritis, unspecified site: Secondary | ICD-10-CM | POA: Diagnosis not present

## 2021-09-12 DIAGNOSIS — J209 Acute bronchitis, unspecified: Secondary | ICD-10-CM | POA: Diagnosis not present

## 2021-09-14 ENCOUNTER — Ambulatory Visit (INDEPENDENT_AMBULATORY_CARE_PROVIDER_SITE_OTHER): Payer: Medicare Other | Admitting: Urology

## 2021-09-14 ENCOUNTER — Other Ambulatory Visit: Payer: Self-pay

## 2021-09-14 ENCOUNTER — Encounter: Payer: Self-pay | Admitting: Urology

## 2021-09-14 VITALS — BP 162/67 | HR 88

## 2021-09-14 DIAGNOSIS — R3912 Poor urinary stream: Secondary | ICD-10-CM

## 2021-09-14 DIAGNOSIS — C61 Malignant neoplasm of prostate: Secondary | ICD-10-CM

## 2021-09-14 DIAGNOSIS — N401 Enlarged prostate with lower urinary tract symptoms: Secondary | ICD-10-CM

## 2021-09-14 DIAGNOSIS — R972 Elevated prostate specific antigen [PSA]: Secondary | ICD-10-CM | POA: Diagnosis not present

## 2021-09-14 DIAGNOSIS — R351 Nocturia: Secondary | ICD-10-CM | POA: Diagnosis not present

## 2021-09-14 DIAGNOSIS — N138 Other obstructive and reflux uropathy: Secondary | ICD-10-CM | POA: Diagnosis not present

## 2021-09-14 LAB — URINALYSIS, ROUTINE W REFLEX MICROSCOPIC
Bilirubin, UA: NEGATIVE
Glucose, UA: NEGATIVE
Leukocytes,UA: NEGATIVE
Nitrite, UA: NEGATIVE
Protein,UA: NEGATIVE
RBC, UA: NEGATIVE
Specific Gravity, UA: >1.03 — ABNORMAL HIGH (ref 1.005–1.030)
Urobilinogen, Ur: 0.2 mg/dL (ref 0.2–1.0)
pH, UA: 5 (ref 5.0–7.5)

## 2021-09-14 MED ORDER — FINASTERIDE 5 MG PO TABS: 5.0000 mg | ORAL_TABLET | Freq: Every day | ORAL | 3 refills | Status: DC

## 2021-09-14 NOTE — Addendum Note (Signed)
Addended by: Irine Seal on: 09/14/2021 09:28 PM   Modules accepted: Level of Service

## 2021-09-14 NOTE — Progress Notes (Signed)
Urological Symptom Review  Patient is experiencing the following symptoms: Hard to postpone urination Get up at night to urinate   Review of Systems  Gastrointestinal (upper)  : Negative for upper GI symptoms  Gastrointestinal (lower) : Negative for lower GI symptoms  Constitutional : Negative for symptoms  Skin: Negative for skin symptoms  Eyes: Negative for eye symptoms  Ear/Nose/Throat : Negative for Ear/Nose/Throat symptoms  Hematologic/Lymphatic: Negative for Hematologic/Lymphatic symptoms  Cardiovascular : Negative for cardiovascular symptoms  Respiratory : Negative for respiratory symptoms  Endocrine: Negative for endocrine symptoms  Musculoskeletal: Negative for musculoskeletal symptoms  Neurological: Negative for neurological symptoms  Psychologic: Negative for psychiatric symptoms

## 2021-09-14 NOTE — Progress Notes (Signed)
Subjective:  1. Prostate cancer ([place])   2. Elevated PSA   3. BPH with urinary obstruction   4. Nocturia   5. Weak urinary stream       I have prostate cancer.  09/14/21: Wesley Reynolds returns today in f/u.  His PSA is up further to 36.7 from 35.9 on 06/01/21 and 34 03/07/21.   He lost his son in law a few months ago and then his daughter more recently from liver cancer.  He has moderate LUTS with an IPSS of 11 and nocturia x 2.   He has some increased urgency with increased water intake.   He was placed on Augmentin for URI symptoms.  He has no bone pain or weight loss.    06/08/21: Wesley Reynolds returns today in f/u for the history noted below.   He was given finasteride at his last visit but never started it.  His PSA continues to rise slowly and was 35.9 on 06/01/21 from 34 on 03/07/21.  His has stable LUTS and his IPSS is 5 with nocturia x 2.   03/09/21: Wesley Reynolds returns today for his history of locally progressive prostate cancer.  His PSA continues to rise but relatively slowly and is up to 34 from 32.5 in 10/21.  The PSMA PET in 10/21 only showed local uptake.  He has mild LUTS with nocturia x 2.  His UA is clear. His IPSS is 4.   10/20/20: Wesley Reynolds returns today in f/u to discuss the results of the PSMA PET scan done on 08/04/20.  The study showed intense uptake in the prostate but no metastatic disease.   He is well with and IPSS of 5.  He had some numbness in the left arm last week when he leans up against a couch arm but it resolves when he lifts it up.  His weight is stable and he has no bone pain. He had bloodwork this week and I don't have the results.     07/22/20:  Wesley Reynolds returns today in f/u for his history of prostate cancer. He has intermediate risk disease on his biopsy on 03/17/18.  His PSA prior to this visit is up to 32.5 from  23.6 on 11/16/19 with the prior PSA of 22.9 on 08/07/19 which was stable over the last 3 months and in his usual range for the last 24 months.  He had an [employer]  PET on 04/15/18 that showed only prostate uptake. His last biopsy was an MRI fusion biopsy on 03/17/18. He had a total 19 biopsied done. There was Gleason 6 in 30% of a single core from the ROI1 in the right TZ out of the 4 biopsies. The 3 biopsies from the left PZ ROI were negative. he had 2 cores on the left with 5% Gleason 6 and a single core at the right apex with Gleason 7(3+4) in 40%. His prostate is 16ml. On the MRI there was several sclerotic lesions in the pelvic bones that are suspicious for mets but that was not confirmed on the PET. He was biopsied because the PSA had jumped up to 27 but it fell back to 18 prior to the biopsy and has otherwise been stable for the last 2.5-3 years. He is voiding well but has nocturia 2-3x and has some time going back to sleep.  He has some urgency as well.  He has no bone pain or weight loss. He has gained some weight. His IPSS is 4. He has no  associated signs or symptoms.      IPSS     Row Name 09/14/21 1000         International Prostate Symptom Score   How often have you had the sensation of not emptying your bladder? About half the time     How often have you had to urinate less than every two hours? Less than 1 in 5 times     How often have you found you stopped and started again several times when you urinated? Less than 1 in 5 times     How often have you found it difficult to postpone urination? Less than half the time     How often have you had a weak urinary stream? Less than 1 in 5 times     How often have you had to strain to start urination? Less than 1 in 5 times     How many times did you typically get up at night to urinate? 2 Times     Total IPSS Score 11       Quality of Life due to urinary symptoms   If you were to spend the rest of your life with your urinary condition just the way it is now how would you feel about that? Mostly Satisfied                 ROS:  ROS:  A complete review of systems was performed.  All systems  are negative except for pertinent findings as noted.   Review of Systems  All other systems reviewed and are negative.  No Known Allergies  Outpatient Encounter Medications as of 09/14/2021  Medication Sig   Cholecalciferol (D3 PO) Take by mouth.   Cyanocobalamin (B-12 PO) Take by mouth.   finasteride (PROSCAR) 5 MG tablet Take 1 tablet (5 mg total) by mouth daily.   ipratropium (ATROVENT) 0.03 % nasal spray Place 2 sprays into both nostrils 3 (three) times daily.   Multiple Vitamins-Minerals (MULTIVITAMINS THER. W/MINERALS) TABS Take 1 tablet by mouth daily.   NIFEdipine (PROCARDIA-XL/ADALAT-CC/NIFEDICAL-XL) 30 MG 24 hr tablet Take 30 mg by mouth 2 (two) times daily.   Omega-3 Fatty Acids (FISH OIL) 1000 MG CAPS Take 1,000 mg by mouth daily.   QC PETROLEUM JELLY 100 % ointment APPLY TO AFFECTED AREASOAT BEDTIME.   Salicylic Acid POWD Apply topically.   triamcinolone cream (KENALOG) 0.1 % Apply topically.   zolpidem (AMBIEN) 10 MG tablet Take 10 mg by mouth at bedtime as needed.   amoxicillin-clavulanate (AUGMENTIN) 875-125 MG tablet SMARTSIG:1 Tablet(s) By Mouth Every 12 Hours   No facility-administered encounter medications on file as of 09/14/2021.    Past Medical History:  Diagnosis Date   BPH (benign prostatic hyperplasia)    GI bleed 11/2014   Hypertension     Past Surgical History:  Procedure Laterality Date   APPENDECTOMY     CHOLECYSTECTOMY     COLONOSCOPY N/A 03/26/2013   Procedure: COLONOSCOPY;  Surgeon: Rogene Houston, MD;  [facility]: [facility];  Service: Endoscopy;  Laterality: N/A;  830   COLONOSCOPY N/A 06/25/2018   Procedure: COLONOSCOPY;  Surgeon: Rogene Houston, MD;  Location: [facility];  Service: Endoscopy;  Laterality: N/A;  200   CYST EXCISION  1960 ?   spine     Social History   Socioeconomic History   Marital status: Married    Spouse name: Not on file   Number of children: Not on file  Years of education: Not on file   Highest education  level: Not on file  Occupational History   Not on file  Tobacco Use   Smoking status: Never   Smokeless tobacco: Never  Vaping Use   Vaping Use: Never used  Substance and Sexual Activity   Alcohol use: No   Drug use: Never   Sexual activity: Not on file  Other Topics Concern   Not on file  Social History Narrative   Not on file   Social Determinants of Health   Financial Resource Strain: Not on file  Food Insecurity: Not on file  Transportation Needs: Not on file  Physical Activity: Not on file  Stress: Not on file  Social Connections: Not on file  Intimate Partner Violence: Not on file    Family History  Problem Relation Age of Onset   Cancer Brother        Objective: BP (!) 162/67   Pulse 88     Physical Exam Constitutional:      Appearance: Normal appearance.  Lymphadenopathy:     Cervical: No cervical adenopathy.     Upper Body:     Right upper body: No supraclavicular or axillary adenopathy.     Left upper body: No supraclavicular or axillary adenopathy.  Neurological:     Mental Status: He is alert.    Lab Results:  Results for orders placed or performed in visit on 09/14/21 (from the past 24 hour(s))  Urinalysis, Routine w reflex microscopic     Status: Abnormal   Collection Time: 09/14/21 11:13 AM  Result Value Ref Range   Specific Gravity, UA >1.030 (H) 1.005 - 1.030   pH, UA 5.0 5.0 - 7.5   Color, UA Yellow Yellow   Appearance Ur Clear Clear   Leukocytes,UA Negative Negative   Protein,UA Negative Negative/Trace   Glucose, UA Negative Negative   Ketones, UA Trace (A) Negative   RBC, UA Negative Negative   Bilirubin, UA Negative Negative   Urobilinogen, Ur 0.2 0.2 - 1.0 mg/dL   Nitrite, UA Negative Negative   Microscopic Examination Comment    Narrative   Performed at:  [facility] [address] [place], [place]  [id number] Lab Director: [place], Phone:  [phone]    BMET No results for input(s): NA, K,  CL, CO2, GLUCOSE, BUN, CREATININE, CALCIUM in the last 72 hours. PSA Recent Results (from the past 2160 hour(s))  PSA     Status: Abnormal   Collection Time: 09/06/21  5:37 PM  Result Value Ref Range   Prostate Specific Ag, Serum 36.7 (H) 0.0 - 4.0 ng/mL    Comment: Roche ECLIA methodology. According to the American Urological Association, Serum PSA should decrease and remain at undetectable levels after radical prostatectomy. The AUA defines biochemical recurrence as an initial PSA value 0.2 ng/mL or greater followed by a subsequent confirmatory PSA value 0.2 ng/mL or greater. Values obtained with different assay methods or kits cannot be used interchangeably. Results cannot be interpreted as absolute evidence of the presence or absence of malignant disease.   Urinalysis, Routine w reflex microscopic     Status: Abnormal   Collection Time: 09/14/21 11:13 AM  Result Value Ref Range   Specific Gravity, UA >1.030 (H) 1.005 - 1.030   pH, UA 5.0 5.0 - 7.5   Color, UA Yellow Yellow   Appearance Ur Clear Clear   Leukocytes,UA Negative Negative   Protein,UA Negative Negative/Trace   Glucose, UA Negative  Negative   Ketones, UA Trace (A) Negative   RBC, UA Negative Negative   Bilirubin, UA Negative Negative   Urobilinogen, Ur 0.2 0.2 - 1.0 mg/dL   Nitrite, UA Negative Negative   Microscopic Examination Comment     Comment: Microscopic follows if indicated.     PSA  Date Value Ref Range Status  11/16/2019 23.6 (H) < OR = 4.0 ng/mL Final    Comment:    The total PSA value from this assay system is  standardized against the WHO standard. The test  result will be approximately 20% lower when compared  to the equimolar-standardized total PSA (Beckman  Coulter). Comparison of serial PSA results should be  interpreted with this fact in mind. . This test was performed using the Siemens  chemiluminescent method. Values obtained from  different assay methods cannot be  used interchangeably. PSA levels, regardless of value, should not be interpreted as absolute evidence of the presence or absence of disease.    Lab Results  Component Value Date   PSA1 36.7 (H) 09/06/2021   PSA1 35.9 (H) 06/01/2021   PSA1 34.0 (H) 03/07/2021    Results for orders placed or performed in visit on 09/14/21 (from the past 24 hour(s))  Urinalysis, Routine w reflex microscopic     Status: Abnormal   Collection Time: 09/14/21 11:13 AM  Result Value Ref Range   Specific Gravity, UA >1.030 (H) 1.005 - 1.030   pH, UA 5.0 5.0 - 7.5   Color, UA Yellow Yellow   Appearance Ur Clear Clear   Leukocytes,UA Negative Negative   Protein,UA Negative Negative/Trace   Glucose, UA Negative Negative   Ketones, UA Trace (A) Negative   RBC, UA Negative Negative   Bilirubin, UA Negative Negative   Urobilinogen, Ur 0.2 0.2 - 1.0 mg/dL   Nitrite, UA Negative Negative   Microscopic Examination Comment    Narrative   Performed at:  [facility] [address] [place], [place]  [id number] Lab Director: [place], Phone:  [phone]      No results found for: TESTOSTERONE    Studies/Results: 08/04/20: PSMA PET   1. Intense focal activity within the prostate gland transitional zone consistent with prostate carcinoma. 2. No evidence of pelvic nodal metastasis or retroperitoneal nodal metastasis. 3. No evidence of visceral metastasis or skeletal metastasis. .   Assessment & Plan: Prostate cancer with a rising PSA but only local disease on the PSMA PET.  His PSA is up further so I will have him restaged with a PSMA PET since it has been a year and his PSA is up to 36 from 23 prior to the last PET.   I have resent the finasteride and he will start it after the scan..   I discussed options including continued observation, finasteride, XRT or ADT as well.  I will have him return in 3 months with a PSA.   He has worsening nocturia and urgency with Moderate LUTS.   I discussed the pros and cons of finasteride therapy  amd will start that after the PET.     Meds ordered this encounter  Medications   finasteride (PROSCAR) 5 MG tablet    Sig: Take 1 tablet (5 mg total) by mouth daily.    Dispense:  90 tablet    Refill:  3      Orders Placed This Encounter  Procedures   NM PET (PSMA) SKULL TO MID THIGH    Standing Status:  Future    Standing Expiration Date:   09/14/2022    Order Specific Question:   If indicated for the ordered procedure, I authorize the administration of a radiopharmaceutical per Radiology protocol    Answer:   Yes    Order Specific Question:   Preferred imaging location?    Answer:   [facility]   Urinalysis, Routine w reflex microscopic   PSA    Standing Status:   Future    Standing Expiration Date:   03/15/2022      Return in about 3 months (around 12/13/2021) for with PSA.   CC: Sharilyn Sites, MD      Irine Seal 09/14/2021 Patient ID: Wesley Reynolds, male   DOB: 03-20-1931, 87 y.o.   MRN: [id number] Patient ID: Wesley Reynolds, male   DOB: November 19, 1930, 87 y.o.   MRN: [id number]

## 2021-09-21 ENCOUNTER — Ambulatory Visit (HOSPITAL_COMMUNITY): Payer: Medicare Other

## 2021-09-27 ENCOUNTER — Ambulatory Visit (HOSPITAL_COMMUNITY): Payer: Medicare Other

## 2021-10-05 ENCOUNTER — Encounter (HOSPITAL_COMMUNITY)
Admission: RE | Admit: 2021-10-05 | Discharge: 2021-10-05 | Disposition: A | Discharge status: Home / Self Care | Payer: Medicare Other | Source: Ambulatory Visit | Attending: Urology | Admitting: Urology

## 2021-10-05 ENCOUNTER — Other Ambulatory Visit: Payer: Self-pay

## 2021-10-05 DIAGNOSIS — R972 Elevated prostate specific antigen [PSA]: Secondary | ICD-10-CM | POA: Diagnosis not present

## 2021-10-05 DIAGNOSIS — K5790 Diverticulosis of intestine, part unspecified, without perforation or abscess without bleeding: Secondary | ICD-10-CM | POA: Diagnosis not present

## 2021-10-05 DIAGNOSIS — C61 Malignant neoplasm of prostate: Secondary | ICD-10-CM | POA: Diagnosis not present

## 2021-10-05 DIAGNOSIS — J8410 Pulmonary fibrosis, unspecified: Secondary | ICD-10-CM | POA: Diagnosis not present

## 2021-10-05 DIAGNOSIS — I2510 Atherosclerotic heart disease of native coronary artery without angina pectoris: Secondary | ICD-10-CM | POA: Diagnosis not present

## 2021-10-05 MED ORDER — PIFLIFOLASTAT F 18 (PYLARIFY) INJECTION
9.0000 | Freq: Once | INTRAVENOUS | Status: AC
  Administered 2021-10-05: 14:00:00 9 via INTRAVENOUS

## 2021-12-14 ENCOUNTER — Other Ambulatory Visit: Payer: Medicare Other

## 2021-12-14 ENCOUNTER — Other Ambulatory Visit: Payer: Self-pay

## 2021-12-14 DIAGNOSIS — C61 Malignant neoplasm of prostate: Secondary | ICD-10-CM | POA: Diagnosis not present

## 2021-12-14 DIAGNOSIS — R972 Elevated prostate specific antigen [PSA]: Secondary | ICD-10-CM | POA: Diagnosis not present

## 2021-12-15 LAB — PSA: Prostate Specific Ag, Serum: 10.7 ng/mL — ABNORMAL HIGH (ref 0.0–4.0)

## 2021-12-21 ENCOUNTER — Ambulatory Visit (INDEPENDENT_AMBULATORY_CARE_PROVIDER_SITE_OTHER): Payer: Medicare Other | Admitting: Urology

## 2021-12-21 ENCOUNTER — Other Ambulatory Visit: Payer: Self-pay

## 2021-12-21 VITALS — BP 144/63 | HR 71

## 2021-12-21 DIAGNOSIS — R3915 Urgency of urination: Secondary | ICD-10-CM | POA: Diagnosis not present

## 2021-12-21 DIAGNOSIS — R351 Nocturia: Secondary | ICD-10-CM

## 2021-12-21 DIAGNOSIS — N138 Other obstructive and reflux uropathy: Secondary | ICD-10-CM

## 2021-12-21 DIAGNOSIS — C61 Malignant neoplasm of prostate: Secondary | ICD-10-CM | POA: Diagnosis not present

## 2021-12-21 DIAGNOSIS — R972 Elevated prostate specific antigen [PSA]: Secondary | ICD-10-CM | POA: Diagnosis not present

## 2021-12-21 DIAGNOSIS — R3912 Poor urinary stream: Secondary | ICD-10-CM

## 2021-12-21 DIAGNOSIS — N401 Enlarged prostate with lower urinary tract symptoms: Secondary | ICD-10-CM

## 2021-12-21 NOTE — Progress Notes (Signed)
Subjective:  1. Prostate cancer ([place])   2. BPH with urinary obstruction   3. Weak urinary stream   4. Nocturia   5. Urgency of urination   6. Elevated PSA       I have prostate cancer.  12/21/21: Mr. Folkert returns today in f/u.  He has been taking the finasteride and his PSA is down to 10.7 from 36.7.   A PMSA PET on 10/05/21 showed only local prostate uptake.  He is voiding with an IPSS of 14.  He has some intermittency.  He has nocturia x 2.  He has a reduced stream.   He is happy with the response to finasteride.  He has no weight loss or bone pain.     09/14/21: Mr. Fouche returns today in f/u.  His PSA is up further to 36.7 from 35.9 on 06/01/21 and 34 03/07/21.   He lost his son in law a few months ago and then his daughter more recently from liver cancer.  He has moderate LUTS with an IPSS of 11 and nocturia x 2.   He has some increased urgency with increased water intake.   He was placed on Augmentin for URI symptoms.  He has no bone pain or weight loss.    06/08/21: Mr Postema returns today in f/u for the history noted below.   He was given finasteride at his last visit but never started it.  His PSA continues to rise slowly and was 35.9 on 06/01/21 from 34 on 03/07/21.  His has stable LUTS and his IPSS is 5 with nocturia x 2.   03/09/21: Mr. Parkerson returns today for his history of locally progressive prostate cancer.  His PSA continues to rise but relatively slowly and is up to 34 from 32.5 in 10/21.  The PSMA PET in 10/21 only showed local uptake.  He has mild LUTS with nocturia x 2.  His UA is clear. His IPSS is 4.   10/20/20: Mr Scarpelli returns today in f/u to discuss the results of the PSMA PET scan done on 08/04/20.  The study showed intense uptake in the prostate but no metastatic disease.   He is well with and IPSS of 5.  He had some numbness in the left arm last week when he leans up against a couch arm but it resolves when he lifts it up.  His weight is stable and he has no bone pain. He  had bloodwork this week and I don't have the results.     07/22/20:  Mr. Quirino returns today in f/u for his history of prostate cancer. He has intermediate risk disease on his biopsy on 03/17/18.  His PSA prior to this visit is up to 32.5 from  23.6 on 11/16/19 with the prior PSA of 22.9 on 08/07/19 which was stable over the last 3 months and in his usual range for the last 24 months.  He had an Axumin PET on 04/15/18 that showed only prostate uptake. His last biopsy was an MRI fusion biopsy on 03/17/18. He had a total 19 biopsied done. There was Gleason 6 in 30% of a single core from the ROI1 in the right TZ out of the 4 biopsies. The 3 biopsies from the left PZ ROI were negative. he had 2 cores on the left with 5% Gleason 6 and a single core at the right apex with Gleason 7(3+4) in 40%. His prostate is 28m. On the MRI there was several sclerotic lesions in the  pelvic bones that are suspicious for mets but that was not confirmed on the PET. He was biopsied because the PSA had jumped up to 27 but it fell back to 18 prior to the biopsy and has otherwise been stable for the last 2.5-3 years. He is voiding well but has nocturia 2-3x and has some time going back to sleep.  He has some urgency as well.  He has no bone pain or weight loss. He has gained some weight. His IPSS is 4. He has no associated signs or symptoms.      IPSS     Row Name 12/21/21 1200         International Prostate Symptom Score   How often have you had the sensation of not emptying your bladder? About half the time     How often have you had to urinate less than every two hours? Less than half the time     How often have you found you stopped and started again several times when you urinated? About half the time     How often have you found it difficult to postpone urination? Less than 1 in 5 times     How often have you had a weak urinary stream? About half the time     How often have you had to strain to start urination? Not at All      How many times did you typically get up at night to urinate? 2 Times     Total IPSS Score 14       Quality of Life due to urinary symptoms   If you were to spend the rest of your life with your urinary condition just the way it is now how would you feel about that? Mostly Satisfied              [place] Name 12/21/21 1200         International Prostate Symptom Score   How often have you had the sensation of not emptying your bladder? About half the time     How often have you had to urinate less than every two hours? Less than half the time     How often have you found you stopped and started again several times when you urinated? About half the time     How often have you found it difficult to postpone urination? Less than 1 in 5 times     How often have you had a weak urinary stream? About half the time     How often have you had to strain to start urination? Not at All     How many times did you typically get up at night to urinate? 2 Times     Total IPSS Score 14       Quality of Life due to urinary symptoms   If you were to spend the rest of your life with your urinary condition just the way it is now how would you feel about that? Mostly Satisfied                   ROS:  ROS:  A complete review of systems was performed.  All systems are negative except for pertinent findings as noted.   Review of Systems  All other systems reviewed and are negative.  No Known Allergies  Outpatient Encounter Medications as of 12/21/2021  Medication Sig   Cholecalciferol (D3 PO) Take by mouth.  Cyanocobalamin (B-12 PO) Take by mouth.   finasteride (PROSCAR) 5 MG tablet Take 1 tablet (5 mg total) by mouth daily.   ipratropium (ATROVENT) 0.03 % nasal spray Place 2 sprays into both nostrils 3 (three) times daily.   Multiple Vitamins-Minerals (MULTIVITAMINS THER. W/MINERALS) TABS Take 1 tablet by mouth daily.   NIFEdipine (PROCARDIA-XL/ADALAT-CC/NIFEDICAL-XL) 30 MG 24 hr  tablet Take 30 mg by mouth 2 (two) times daily.   Omega-3 Fatty Acids (FISH OIL) 1000 MG CAPS Take 1,000 mg by mouth daily.   pantoprazole (PROTONIX) 40 MG tablet Take 40 mg by mouth daily.   QC PETROLEUM JELLY 100 % ointment APPLY TO AFFECTED AREASOAT BEDTIME.   Salicylic Acid POWD Apply topically.   triamcinolone cream (KENALOG) 0.1 % Apply topically.   zolpidem (AMBIEN) 10 MG tablet Take 10 mg by mouth at bedtime as needed.   [DISCONTINUED] amoxicillin-clavulanate (AUGMENTIN) 875-125 MG tablet SMARTSIG:1 Tablet(s) By Mouth Every 12 Hours   No facility-administered encounter medications on file as of 12/21/2021.    Past Medical History:  Diagnosis Date   BPH (benign prostatic hyperplasia)    GI bleed 11/2014   Hypertension     Past Surgical History:  Procedure Laterality Date   APPENDECTOMY     CHOLECYSTECTOMY     COLONOSCOPY N/A 03/26/2013   Procedure: COLONOSCOPY;  Surgeon: Rogene Houston, MD;  Location: [facility];  Service: Endoscopy;  Laterality: N/A;  830   COLONOSCOPY N/A 06/25/2018   Procedure: COLONOSCOPY;  Surgeon: Rogene Houston, MD;  Location: [facility];  Service: Endoscopy;  Laterality: N/A;  200   CYST EXCISION  1960 ?   spine     Social History   Socioeconomic History   Marital status: Married    Spouse name: Not on file   Number of children: Not on file   Years of education: Not on file   Highest education level: Not on file  Occupational History   Not on file  Tobacco Use   Smoking status: Never   Smokeless tobacco: Never  Vaping Use   Vaping Use: Never used  Substance and Sexual Activity   Alcohol use: No   Drug use: Never   Sexual activity: Not on file  Other Topics Concern   Not on file  Social History Narrative   Not on file   Social Determinants of Health   Financial Resource Strain: Not on file  Food Insecurity: Not on file  Transportation Needs: Not on file  Physical Activity: Not on file  Stress: Not on file  Social  Connections: Not on file  Intimate Partner Violence: Not on file    Family History  Problem Relation Age of Onset   Cancer Brother        Objective: BP (!) 144/63    Pulse 71     Physical Exam Vitals reviewed.  Constitutional:      Appearance: Normal appearance.  Neurological:     Mental Status: He is alert.    Lab Results:  Results for orders placed or performed in visit on 12/21/21 (from the past 24 hour(s))  Urinalysis, Routine w reflex microscopic     Status: None   Collection Time: 12/21/21  4:11 PM  Result Value Ref Range   Specific Gravity, UA 1.020 1.005 - 1.030   pH, UA 7.0 5.0 - 7.5   Color, UA Yellow Yellow   Appearance Ur Clear Clear   Leukocytes,UA Negative Negative   Protein,UA Negative Negative/Trace   Glucose,  UA Negative Negative   Ketones, UA Negative Negative   RBC, UA Negative Negative   Bilirubin, UA Negative Negative   Urobilinogen, Ur 0.2 0.2 - 1.0 mg/dL   Nitrite, UA Negative Negative   Microscopic Examination Comment    Narrative   Performed at:  [facility] [address] [place], [place]  [id number] Lab Director: [place], Phone:  [phone]     BMET No results for input(s): NA, K, CL, CO2, GLUCOSE, BUN, CREATININE, CALCIUM in the last 72 hours. PSA Recent Results (from the past 2160 hour(s))  PSA     Status: Abnormal   Collection Time: 12/14/21 11:12 AM  Result Value Ref Range   Prostate Specific Ag, Serum 10.7 (H) 0.0 - 4.0 ng/mL    Comment: Roche ECLIA methodology. According to the American Urological Association, Serum PSA should decrease and remain at undetectable levels after radical prostatectomy. The AUA defines biochemical recurrence as an initial PSA value 0.2 ng/mL or greater followed by a subsequent confirmatory PSA value 0.2 ng/mL or greater. Values obtained with different assay methods or kits cannot be used interchangeably. Results cannot be interpreted as absolute evidence of the  presence or absence of malignant disease.   Urinalysis, Routine w reflex microscopic     Status: None   Collection Time: 12/21/21  4:11 PM  Result Value Ref Range   Specific Gravity, UA 1.020 1.005 - 1.030   pH, UA 7.0 5.0 - 7.5   Color, UA Yellow Yellow   Appearance Ur Clear Clear   Leukocytes,UA Negative Negative   Protein,UA Negative Negative/Trace   Glucose, UA Negative Negative   Ketones, UA Negative Negative   RBC, UA Negative Negative   Bilirubin, UA Negative Negative   Urobilinogen, Ur 0.2 0.2 - 1.0 mg/dL   Nitrite, UA Negative Negative   Microscopic Examination Comment     Comment: Microscopic follows if indicated.     PSA  Date Value Ref Range Status  11/16/2019 23.6 (H) < OR = 4.0 ng/mL Final    Comment:    The total PSA value from this assay system is  standardized against the WHO standard. The test  result will be approximately 20% lower when compared  to the equimolar-standardized total PSA (Beckman  Coulter). Comparison of serial PSA results should be  interpreted with this fact in mind. . This test was performed using the Siemens  chemiluminescent method. Values obtained from  different assay methods cannot be used interchangeably. PSA levels, regardless of value, should not be interpreted as absolute evidence of the presence or absence of disease.    Lab Results  Component Value Date   PSA1 10.7 (H) 12/14/2021   PSA1 36.7 (H) 09/06/2021   PSA1 35.9 (H) 06/01/2021    Results for orders placed or performed in visit on 12/21/21 (from the past 24 hour(s))  Urinalysis, Routine w reflex microscopic     Status: None   Collection Time: 12/21/21  4:11 PM  Result Value Ref Range   Specific Gravity, UA 1.020 1.005 - 1.030   pH, UA 7.0 5.0 - 7.5   Color, UA Yellow Yellow   Appearance Ur Clear Clear   Leukocytes,UA Negative Negative   Protein,UA Negative Negative/Trace   Glucose, UA Negative Negative   Ketones, UA Negative Negative   RBC, UA Negative  Negative   Bilirubin, UA Negative Negative   Urobilinogen, Ur 0.2 0.2 - 1.0 mg/dL   Nitrite, UA Negative Negative   Microscopic Examination Comment  Narrative   Performed at:  [facility] [address] [place], [place]  [id number] Lab Director: [place], Phone:  [phone]       No results found for: TESTOSTERONE    Studies/Results: NM PET (PSMA) SKULL TO MID THIGH  Result Date: 10/06/2021 CLINICAL DATA:  88 year old male with prostate carcinoma. PSA equal 36.7 prostate cancer diagnosis 2019 with Gleason 7 adenocarcinoma. EXAM: NUCLEAR MEDICINE PET SKULL BASE TO THIGH TECHNIQUE: 9.23 mCi F18 Piflufolastat (Pylarify) was injected intravenously. Full-ring PET imaging was performed from the skull base to thigh after the radiotracer. CT data was obtained and used for attenuation correction and anatomic localization. COMPARISON:  08/04/2020 FINDINGS: NECK No radiotracer activity in neck lymph nodes. Incidental CT finding: None CHEST No radiotracer accumulation within mediastinal or hilar lymph nodes. 5 mm nodule in the LEFT lower lobe unchanged from comparison exam (image 103/CT series 4). No associated radiotracer activity. Benign calcified granuloma in the RIGHT upper lobe. Incidental CT finding: Coronary artery calcification and aortic atherosclerotic calcification. ABDOMEN/PELVIS Prostate: There is intense radiotracer activity centrally within the LEFT and RIGHT lobe of the prostate gland. The pattern activity is very similar to comparison PSMA PET scan. Activity is intense with SUV max equal 13.1 in the RIGHT gland compared to 23.9. SUV max equal 9.2 in the LEFT gland compared SUV max equal 7.4. Lymph nodes: No abnormal radiotracer accumulation within pelvic or abdominal nodes. Liver: No evidence of liver metastasis Incidental CT finding: Pan diverticulosis without evidence diverticulitis. Enlarged prostate gland. SKELETON No focal  activity to suggest skeletal  metastasis. IMPRESSION: 1. No evidence of prostate carcinoma outside of the prostate gland. No nodal metastasis, visceral metastasis, or skeletal metastasis. 2. Intense radiotracer activity centrally within LEFT and RIGHT lobe of the prostate gland is similar pattern and intensity to comparison PSMA PET scan 08/04/2028. Findings consistent with high-grade adenocarcinoma. Electronically Signed   By: Suzy Bouchard M.D.   On: 10/06/2021 13:52      Assessment & Plan: Prostate cancer with a rising PSA but only local disease on the PSMA PET.   He has been on the finasteride without side effects and his PSA is down to 10.7.  BPH with LUTs.  His symptoms have improved on the finasteride.     No orders of the defined types were placed in this encounter.     Orders Placed This Encounter  Procedures   Urinalysis, Routine w reflex microscopic   PSA    Standing Status:   Future    Standing Expiration Date:   06/23/2022      Return in about 3 months (around 03/23/2022) for with PSA.   CC: Sharilyn Sites, MD      Irine Seal 12/22/2021 Patient ID: Bernie Covey, male   DOB: 1930/11/05, 88 y.o.   MRN: [id number] Patient ID: ZIYON SOLTAU, male   DOB: 08-Dec-1930, 88 y.o.   MRN: [id number]

## 2021-12-22 ENCOUNTER — Encounter: Payer: Self-pay | Admitting: Urology

## 2021-12-22 LAB — URINALYSIS, ROUTINE W REFLEX MICROSCOPIC
Bilirubin, UA: NEGATIVE
Glucose, UA: NEGATIVE
Ketones, UA: NEGATIVE
Leukocytes,UA: NEGATIVE
Nitrite, UA: NEGATIVE
Protein,UA: NEGATIVE
RBC, UA: NEGATIVE
Specific Gravity, UA: 1.02 (ref 1.005–1.030)
Urobilinogen, Ur: 0.2 mg/dL (ref 0.2–1.0)
pH, UA: 7 (ref 5.0–7.5)

## 2021-12-26 DIAGNOSIS — R5383 Other fatigue: Secondary | ICD-10-CM | POA: Diagnosis not present

## 2021-12-26 DIAGNOSIS — E119 Type 2 diabetes mellitus without complications: Secondary | ICD-10-CM | POA: Diagnosis not present

## 2021-12-26 DIAGNOSIS — Z6823 Body mass index (BMI) 23.0-23.9, adult: Secondary | ICD-10-CM | POA: Diagnosis not present

## 2021-12-26 DIAGNOSIS — R55 Syncope and collapse: Secondary | ICD-10-CM | POA: Diagnosis not present

## 2021-12-26 DIAGNOSIS — I10 Essential (primary) hypertension: Secondary | ICD-10-CM | POA: Diagnosis not present

## 2021-12-26 DIAGNOSIS — M1991 Primary osteoarthritis, unspecified site: Secondary | ICD-10-CM | POA: Diagnosis not present

## 2021-12-26 DIAGNOSIS — C61 Malignant neoplasm of prostate: Secondary | ICD-10-CM | POA: Diagnosis not present

## 2022-01-01 DIAGNOSIS — G4700 Insomnia, unspecified: Secondary | ICD-10-CM | POA: Diagnosis not present

## 2022-01-01 DIAGNOSIS — Z6823 Body mass index (BMI) 23.0-23.9, adult: Secondary | ICD-10-CM | POA: Diagnosis not present

## 2022-03-01 DIAGNOSIS — Z6822 Body mass index (BMI) 22.0-22.9, adult: Secondary | ICD-10-CM | POA: Diagnosis not present

## 2022-03-01 DIAGNOSIS — R1310 Dysphagia, unspecified: Secondary | ICD-10-CM | POA: Diagnosis not present

## 2022-03-05 ENCOUNTER — Encounter (INDEPENDENT_AMBULATORY_CARE_PROVIDER_SITE_OTHER): Payer: Self-pay | Admitting: *Deleted

## 2022-04-05 ENCOUNTER — Other Ambulatory Visit: Payer: Medicare Other

## 2022-04-05 DIAGNOSIS — C61 Malignant neoplasm of prostate: Secondary | ICD-10-CM | POA: Diagnosis not present

## 2022-04-06 LAB — PSA: Prostate Specific Ag, Serum: 8.7 ng/mL — ABNORMAL HIGH (ref 0.0–4.0)

## 2022-04-12 ENCOUNTER — Ambulatory Visit (INDEPENDENT_AMBULATORY_CARE_PROVIDER_SITE_OTHER): Payer: Medicare Other | Admitting: Urology

## 2022-04-12 VITALS — BP 150/75 | HR 48

## 2022-04-12 DIAGNOSIS — C61 Malignant neoplasm of prostate: Secondary | ICD-10-CM | POA: Diagnosis not present

## 2022-04-12 DIAGNOSIS — R3912 Poor urinary stream: Secondary | ICD-10-CM

## 2022-04-12 DIAGNOSIS — R972 Elevated prostate specific antigen [PSA]: Secondary | ICD-10-CM

## 2022-04-12 DIAGNOSIS — Z8546 Personal history of malignant neoplasm of prostate: Secondary | ICD-10-CM | POA: Diagnosis not present

## 2022-04-12 DIAGNOSIS — N401 Enlarged prostate with lower urinary tract symptoms: Secondary | ICD-10-CM | POA: Diagnosis not present

## 2022-04-12 DIAGNOSIS — R351 Nocturia: Secondary | ICD-10-CM | POA: Diagnosis not present

## 2022-04-12 DIAGNOSIS — N138 Other obstructive and reflux uropathy: Secondary | ICD-10-CM

## 2022-04-12 DIAGNOSIS — R3915 Urgency of urination: Secondary | ICD-10-CM | POA: Diagnosis not present

## 2022-04-12 LAB — URINALYSIS, ROUTINE W REFLEX MICROSCOPIC
Bilirubin, UA: NEGATIVE
Glucose, UA: NEGATIVE
Ketones, UA: NEGATIVE
Leukocytes,UA: NEGATIVE
Nitrite, UA: NEGATIVE
Protein,UA: NEGATIVE
RBC, UA: NEGATIVE
Specific Gravity, UA: 1.015 (ref 1.005–1.030)
Urobilinogen, Ur: 0.2 mg/dL (ref 0.2–1.0)
pH, UA: 6 (ref 5.0–7.5)

## 2022-04-12 NOTE — Progress Notes (Signed)
Subjective:  1. Prostate cancer ([place])   2. Elevated PSA   3. BPH with urinary obstruction   4. Weak urinary stream   5. Nocturia   6. Urgency of urination       I have prostate cancer.  04/12/22: Wesley Reynolds returns today in f/u for his history of Gleason 6 prostate cancer on surveillance.  He remains on finasteride for BPH with BOO and his PSA has fallen further to 8.7.  His IPSS is 8.  He is voiding ok with nocutria x 2.  He has no side effects with the med.    12/21/21: Wesley Reynolds returns today in f/u.  He has been taking the finasteride and his PSA is down to 10.7 from 36.7.   A PMSA PET on 10/05/21 showed only local prostate uptake.  He is voiding with an IPSS of 14.  He has some intermittency.  He has nocturia x 2.  He has a reduced stream.   He is happy with the response to finasteride.  He has no weight loss or bone pain.     09/14/21: Wesley Reynolds returns today in f/u.  His PSA is up further to 36.7 from 35.9 on 06/01/21 and 34 03/07/21.   He lost his son in law a few months ago and then his daughter more recently from liver cancer.  He has moderate LUTS with an IPSS of 11 and nocturia x 2.   He has some increased urgency with increased water intake.   He was placed on Augmentin for URI symptoms.  He has no bone pain or weight loss.    06/08/21: Wesley Reynolds returns today in f/u for the history noted below.   He was given finasteride at his last visit but never started it.  His PSA continues to rise slowly and was 35.9 on 06/01/21 from 34 on 03/07/21.  His has stable LUTS and his IPSS is 5 with nocturia x 2.   03/09/21: Wesley Reynolds returns today for his history of locally progressive prostate cancer.  His PSA continues to rise but relatively slowly and is up to 34 from 32.5 in 10/21.  The PSMA PET in 10/21 only showed local uptake.  He has mild LUTS with nocturia x 2.  His UA is clear. His IPSS is 4.   10/20/20: Wesley Reynolds returns today in f/u to discuss the results of the PSMA PET scan done on 08/04/20.  The  study showed intense uptake in the prostate but no metastatic disease.   He is well with and IPSS of 5.  He had some numbness in the left arm last week when he leans up against a couch arm but it resolves when he lifts it up.  His weight is stable and he has no bone pain. He had bloodwork this week and I don't have the results.     07/22/20:  Wesley Reynolds returns today in f/u for his history of prostate cancer. He has intermediate risk disease on his biopsy on 03/17/18.  His PSA prior to this visit is up to 32.5 from  23.6 on 11/16/19 with the prior PSA of 22.9 on 08/07/19 which was stable over the last 3 months and in his usual range for the last 24 months.  He had an Axumin PET on 04/15/18 that showed only prostate uptake. His last biopsy was an MRI fusion biopsy on 03/17/18. He had a total 19 biopsied done. There was Gleason 6 in 30% of a single core from  the ROI1 in the right TZ out of the 4 biopsies. The 3 biopsies from the left PZ ROI were negative. he had 2 cores on the left with 5% Gleason 6 and a single core at the right apex with Gleason 7(3+4) in 40%. His prostate is 68m. On the MRI there was several sclerotic lesions in the pelvic bones that are suspicious for mets but that was not confirmed on the PET. He was biopsied because the PSA had jumped up to 27 but it fell back to 18 prior to the biopsy and has otherwise been stable for the last 2.5-3 years. He is voiding well but has nocturia 2-3x and has some time going back to sleep.  He has some urgency as well.  He has no bone pain or weight loss. He has gained some weight. His IPSS is 4. He has no associated signs or symptoms.      IPSS     Row Name 04/12/22 1100         International Prostate Symptom Score   How often have you had the sensation of not emptying your bladder? Not at All     How often have you had to urinate less than every two hours? Less than 1 in 5 times     How often have you found you stopped and started again several times when  you urinated? Less than 1 in 5 times     How often have you found it difficult to postpone urination? Less than half the time     How often have you had a weak urinary stream? Less than half the time     How often have you had to strain to start urination? Not at All     How many times did you typically get up at night to urinate? 2 Times     Total IPSS Score 8       Quality of Life due to urinary symptoms   If you were to spend the rest of your life with your urinary condition just the way it is now how would you feel about that? Mostly Satisfied              IPSS     Row Name 04/12/22 1100         International Prostate Symptom Score   How often have you had the sensation of not emptying your bladder? Not at All     How often have you had to urinate less than every two hours? Less than 1 in 5 times     How often have you found you stopped and started again several times when you urinated? Less than 1 in 5 times     How often have you found it difficult to postpone urination? Less than half the time     How often have you had a weak urinary stream? Less than half the time     How often have you had to strain to start urination? Not at All     How many times did you typically get up at night to urinate? 2 Times     Total IPSS Score 8       Quality of Life due to urinary symptoms   If you were to spend the rest of your life with your urinary condition just the way it is now how would you feel about that? Mostly Satisfied  ROS:  ROS:  A complete review of systems was performed.  All systems are negative except for pertinent findings as noted.   Review of Systems  All other systems reviewed and are negative.   No Known Allergies  Outpatient Encounter Medications as of 04/12/2022  Medication Sig   Cholecalciferol (D3 PO) Take by mouth.   Cyanocobalamin (B-12 PO) Take by mouth.   finasteride (PROSCAR) 5 MG tablet Take 1 tablet (5 mg total) by  mouth daily.   ipratropium (ATROVENT) 0.03 % nasal spray Place 2 sprays into both nostrils 3 (three) times daily.   Multiple Vitamins-Minerals (MULTIVITAMINS THER. W/MINERALS) TABS Take 1 tablet by mouth daily.   NIFEdipine (PROCARDIA-XL/ADALAT-CC/NIFEDICAL-XL) 30 MG 24 hr tablet Take 30 mg by mouth 2 (two) times daily.   Omega-3 Fatty Acids (FISH OIL) 1000 MG CAPS Take 1,000 mg by mouth daily.   pantoprazole (PROTONIX) 40 MG tablet Take 40 mg by mouth daily.   QC PETROLEUM JELLY 100 % ointment APPLY TO AFFECTED AREASOAT BEDTIME.   Salicylic Acid POWD Apply topically.   triamcinolone cream (KENALOG) 0.1 % Apply topically.   zolpidem (AMBIEN) 10 MG tablet Take 10 mg by mouth at bedtime as needed.   No facility-administered encounter medications on file as of 04/12/2022.    Past Medical History:  Diagnosis Date   BPH (benign prostatic hyperplasia)    GI bleed 11/2014   Hypertension     Past Surgical History:  Procedure Laterality Date   APPENDECTOMY     CHOLECYSTECTOMY     COLONOSCOPY N/A 03/26/2013   Procedure: COLONOSCOPY;  Surgeon: Rogene Houston, MD;  [facility];  Service: Endoscopy;  Laterality: N/A;  830   COLONOSCOPY N/A 06/25/2018   Procedure: COLONOSCOPY;  Surgeon: Rogene Houston, MD;  [facility];  Service: Endoscopy;  Laterality: N/A;  200   CYST EXCISION  1960 ?   spine     Social History   Socioeconomic History   Marital status: Married    Spouse name: Not on file   Number of children: Not on file   Years of education: Not on file   Highest education level: Not on file  Occupational History   Not on file  Tobacco Use   Smoking status: Never   Smokeless tobacco: Never  Vaping Use   Vaping Use: Never used  Substance and Sexual Activity   Alcohol use: No   Drug use: Never   Sexual activity: Not on file  Other Topics Concern   Not on file  Social History Narrative   Not on file   Social Determinants of Health   Financial  Resource Strain: Not on file  Food Insecurity: Not on file  Transportation Needs: Not on file  Physical Activity: Not on file  Stress: Not on file  Social Connections: Not on file  Intimate Partner Violence: Not on file    Family History  Problem Relation Age of Onset   Cancer Brother        Objective: BP (!) 150/75   Pulse (!) 48     Physical Exam Vitals reviewed.  Constitutional:      Appearance: Normal appearance.  Neurological:     Mental Status: He is alert.     Lab Results:  Results for orders placed or performed in visit on 04/12/22 (from the past 24 hour(s))  Urinalysis, Routine w reflex microscopic     Status: None   Collection Time: 04/12/22  3:09 PM  Result Value Ref Range   Specific Gravity, UA 1.015 1.005 - 1.030   pH, UA 6.0 5.0 - 7.5   Color, UA Yellow Yellow   Appearance Ur Clear Clear   Leukocytes,UA Negative Negative   Protein,UA Negative Negative/Trace   Glucose, UA Negative Negative   Ketones, UA Negative Negative   RBC, UA Negative Negative   Bilirubin, UA Negative Negative   Urobilinogen, Ur 0.2 0.2 - 1.0 mg/dL   Nitrite, UA Negative Negative   Microscopic Examination Comment    Narrative   Performed at:  [facility] [address] [place], [place]  [id number] Lab Director: Garwood, Phone:  [phone]      BMET No results for input(s): "NA", "K", "CL", "CO2", "GLUCOSE", "BUN", "CREATININE", "CALCIUM" in the last 72 hours. PSA Recent Results (from the past 2160 hour(s))  PSA     Status: Abnormal   Collection Time: 04/05/22  9:55 AM  Result Value Ref Range   Prostate Specific Ag, Serum 8.7 (H) 0.0 - 4.0 ng/mL    Comment: Roche ECLIA methodology. According to the American Urological Association, Serum PSA should decrease and remain at undetectable levels after radical prostatectomy. The AUA defines biochemical recurrence as an initial PSA value 0.2 ng/mL or greater followed by a subsequent  confirmatory PSA value 0.2 ng/mL or greater. Values obtained with different assay methods or kits cannot be used interchangeably. Results cannot be interpreted as absolute evidence of the presence or absence of malignant disease.   Urinalysis, Routine w reflex microscopic     Status: None   Collection Time: 04/12/22  3:09 PM  Result Value Ref Range   Specific Gravity, UA 1.015 1.005 - 1.030   pH, UA 6.0 5.0 - 7.5   Color, UA Yellow Yellow   Appearance Ur Clear Clear   Leukocytes,UA Negative Negative   Protein,UA Negative Negative/Trace   Glucose, UA Negative Negative   Ketones, UA Negative Negative   RBC, UA Negative Negative   Bilirubin, UA Negative Negative   Urobilinogen, Ur 0.2 0.2 - 1.0 mg/dL   Nitrite, UA Negative Negative   Microscopic Examination Comment     Comment: Microscopic follows if indicated.     PSA  Date Value Ref Range Status  11/16/2019 23.6 (H) < OR = 4.0 ng/mL Final    Comment:    The total PSA value from this assay system is  standardized against the WHO standard. The test  result will be approximately 20% lower when compared  to the equimolar-standardized total PSA (Beckman  Coulter). Comparison of serial PSA results should be  interpreted with this fact in mind. . This test was performed using the Siemens  chemiluminescent method. Values obtained from  different assay methods cannot be used interchangeably. PSA levels, regardless of value, should not be interpreted as absolute evidence of the presence or absence of disease.    Lab Results  Component Value Date   PSA1 8.7 (H) 04/05/2022   PSA1 10.7 (H) 12/14/2021   PSA1 36.7 (H) 09/06/2021    Results for orders placed or performed in visit on 04/12/22 (from the past 24 hour(s))  Urinalysis, Routine w reflex microscopic     Status: None   Collection Time: 04/12/22  3:09 PM  Result Value Ref Range   Specific Gravity, UA 1.015 1.005 - 1.030   pH, UA 6.0 5.0 - 7.5   Color, UA Yellow Yellow    Appearance Ur Clear Clear   Leukocytes,UA Negative Negative  Protein,UA Negative Negative/Trace   Glucose, UA Negative Negative   Ketones, UA Negative Negative   RBC, UA Negative Negative   Bilirubin, UA Negative Negative   Urobilinogen, Ur 0.2 0.2 - 1.0 mg/dL   Nitrite, UA Negative Negative   Microscopic Examination Comment    Narrative   Performed at:  [facility] [address], [place], [place]  [id number] Lab Director: Armstrong, Phone:  [phone]    UA is clear.     No results found for: "TESTOSTERONE"    Studies/Results: No results found.    Assessment & Plan: Prostate cancer with a rising PSA but only local disease on the PSMA PET.   He has been on the finasteride without side effects and his PSA is down to 8.7.  BPH with LUTs.  His symptoms have improved on the finasteride.     No orders of the defined types were placed in this encounter.     Orders Placed This Encounter  Procedures   PSA    Standing Status:   Future    Standing Expiration Date:   10/12/2022   Urinalysis, Routine w reflex microscopic      Return in about 3 months (around 07/13/2022) for with PSA.   CC: Sharilyn Sites, MD      Irine Seal 04/13/2022 Patient ID: Wesley Reynolds, male   DOB: 02/18/1931, 88 y.o.   MRN: [id number]

## 2022-04-13 ENCOUNTER — Encounter: Payer: Self-pay | Admitting: Urology

## 2022-04-23 ENCOUNTER — Other Ambulatory Visit (HOSPITAL_COMMUNITY): Payer: Self-pay | Admitting: Family Medicine

## 2022-04-23 ENCOUNTER — Other Ambulatory Visit: Payer: Self-pay | Admitting: Family Medicine

## 2022-04-23 DIAGNOSIS — I739 Peripheral vascular disease, unspecified: Secondary | ICD-10-CM | POA: Diagnosis not present

## 2022-04-23 DIAGNOSIS — Z1331 Encounter for screening for depression: Secondary | ICD-10-CM | POA: Diagnosis not present

## 2022-04-23 DIAGNOSIS — Z6822 Body mass index (BMI) 22.0-22.9, adult: Secondary | ICD-10-CM | POA: Diagnosis not present

## 2022-04-23 DIAGNOSIS — E119 Type 2 diabetes mellitus without complications: Secondary | ICD-10-CM | POA: Diagnosis not present

## 2022-04-23 DIAGNOSIS — I10 Essential (primary) hypertension: Secondary | ICD-10-CM | POA: Diagnosis not present

## 2022-04-23 DIAGNOSIS — R5383 Other fatigue: Secondary | ICD-10-CM | POA: Diagnosis not present

## 2022-04-23 DIAGNOSIS — C61 Malignant neoplasm of prostate: Secondary | ICD-10-CM | POA: Diagnosis not present

## 2022-04-23 DIAGNOSIS — Z0001 Encounter for general adult medical examination with abnormal findings: Secondary | ICD-10-CM | POA: Diagnosis not present

## 2022-04-23 DIAGNOSIS — E782 Mixed hyperlipidemia: Secondary | ICD-10-CM | POA: Diagnosis not present

## 2022-05-01 ENCOUNTER — Other Ambulatory Visit (INDEPENDENT_AMBULATORY_CARE_PROVIDER_SITE_OTHER): Payer: Self-pay

## 2022-05-01 ENCOUNTER — Encounter (INDEPENDENT_AMBULATORY_CARE_PROVIDER_SITE_OTHER): Payer: Self-pay | Admitting: Gastroenterology

## 2022-05-01 ENCOUNTER — Ambulatory Visit (INDEPENDENT_AMBULATORY_CARE_PROVIDER_SITE_OTHER): Payer: Medicare Other | Admitting: Gastroenterology

## 2022-05-01 ENCOUNTER — Encounter (INDEPENDENT_AMBULATORY_CARE_PROVIDER_SITE_OTHER): Payer: Self-pay

## 2022-05-01 VITALS — BP 149/78 | HR 75 | Temp 97.7°F | Ht 69.0 in | Wt 147.9 lb

## 2022-05-01 DIAGNOSIS — R1319 Other dysphagia: Secondary | ICD-10-CM

## 2022-05-01 DIAGNOSIS — R634 Abnormal weight loss: Secondary | ICD-10-CM | POA: Diagnosis not present

## 2022-05-01 DIAGNOSIS — R142 Eructation: Secondary | ICD-10-CM

## 2022-05-01 NOTE — Patient Instructions (Signed)
It was so nice to meet you! Please continue your pantoprazole '40mg'$  twice a day at this time Please keep track of when you notice more upper GI symptoms such as belching and what you ate prior to symptoms as often this is related to our dietary intake We will continue to monitor your weight We will get you scheduled for upper endoscopy for further evaluation of your swallowing, please continue to take small bites, chew thoroughly, avoid thicker, dryer foods that tend to cause issues and try to break or crush pills when you are able to prevent choking.  Follow up 3 months

## 2022-05-01 NOTE — H&P (View-Only) (Signed)
Referring Provider: Sharilyn Sites, MD Primary Care Physician:  Sharilyn Sites, MD Primary GI Physician: new  Chief Complaint  Patient presents with   Dysphagia    Referred for dysphagia. States he has trouble swallowing pills and water. Has a lot of belching.    HPI:   Wesley Reynolds is a 88 y.o. male with past medical history of BPH, GI bleed, HTN, prostate adenocarcinoma (dx 2019).   Patient presenting today as a new patient for dysphagia.   Last labs in march 2023 with normal renal function, LFTs, Hgb and WBC.  He reports that he has been having issues with pills going down for about 3 months. Usually larger pills. Sometimes certain foods such as lettuce, sometimes meats unless he chews them up really well. Dysphagia episodes occur almost every time he swallows a larger pill. He reports that he can usually lean back a little and the pills will go down. Feels that pills stick below the xyphoid process. Denies any issues with liquids. He is belching more for the past few months. PCP had him on pantoprazole '40mg'$  daily, had been on this for some time, he was increased to twice a day about 3 months ago. He reports some improvement in reflux prior to this.  He did not notice much improvement in belching with increase in PPI dosing. Belching seems to be related to certain sauces and foods. Denies nausea, vomiting, abdominal pain. He notes that he does not tend to eat very large meals.   Patient states he was previously working out and eating very healthy in his early 7s, he did this for a few years then with the pandemic he was unable to continue working out at [employer]. He has continued to have gradual weight loss since his early 69s.  Per chart review he was around 160lbs at the end of 2021. He continues to work out some but not as much as previously, and notes that diet is not as strict as before, but typically tries to eat pretty healthy, doing usually a meat and two veggies, however, eats  smaller meals as to not overload his stomach. He does not that his wife had a stroke about 6 months ago which put a lot of stress on him, also notes multiple family losses over the past few years.   Typically has a BM daily, if he goes 2 days without one he will take a laxative, takes metamucil daily since starting finasteride as this caused him some constipation, no rectal bleeding, melena or stool changes otherwise.   NSAID YYT:KPTW Social hx:no etoh or tobacco Fam hx: no significant GI hx  Last Colonoscopy: 06/2018- Diverticulosis in the entire examined colon. - Internal hemorrhoids. No specimens Last Endoscopy:never  Recommendations:  No repeat colonoscopy  Past Medical History:  Diagnosis Date   BPH (benign prostatic hyperplasia)    GI bleed 11/2014   Hypertension     Past Surgical History:  Procedure Laterality Date   APPENDECTOMY     CHOLECYSTECTOMY     COLONOSCOPY N/A 03/26/2013   Procedure: COLONOSCOPY;  Surgeon: Rogene Houston, MD;  [facility];  Service: Endoscopy;  Laterality: N/A;  830   COLONOSCOPY N/A 06/25/2018   Procedure: COLONOSCOPY;  Surgeon: Rogene Houston, MD;  [facility];  Service: Endoscopy;  Laterality: N/A;  200   CYST EXCISION  1960 ?   spine     Current Outpatient Medications  Medication Sig Dispense Refill   Cyanocobalamin (  B-12 PO) Take by mouth. 2500 mg daily     Ensure (ENSURE) Take 237 mLs by mouth. One daily     finasteride (PROSCAR) 5 MG tablet Take 1 tablet (5 mg total) by mouth daily. 90 tablet 3   ipratropium (ATROVENT) 0.03 % nasal spray Place 2 sprays into both nostrils 3 (three) times daily.     Multiple Vitamins-Minerals (MULTIVITAMINS THER. W/MINERALS) TABS Take 1 tablet by mouth daily.     NIFEdipine (PROCARDIA-XL/ADALAT-CC/NIFEDICAL-XL) 30 MG 24 hr tablet Take 30 mg by mouth 2 (two) times daily.  0   Omega-3 Fatty Acids (FISH OIL) 1200 MG CAPS Take 1,200 mg by mouth daily.     pantoprazole (PROTONIX) 40  MG tablet Take 40 mg by mouth 2 (two) times daily.     triamcinolone cream (KENALOG) 0.1 % Apply topically.     zinc gluconate 50 MG tablet Take 50 mg by mouth daily.     zolpidem (AMBIEN) 10 MG tablet Take 10 mg by mouth at bedtime as needed.     No current facility-administered medications for this visit.    Allergies as of 05/01/2022   (No Known Allergies)    Family History  Problem Relation Age of Onset   Cancer Brother     Social History   Socioeconomic History   Marital status: Married    Spouse name: Not on file   Number of children: Not on file   Years of education: Not on file   Highest education level: Not on file  Occupational History   Not on file  Tobacco Use   Smoking status: Never    Passive exposure: Past   Smokeless tobacco: Never  Vaping Use   Vaping Use: Never used  Substance and Sexual Activity   Alcohol use: No   Drug use: Never   Sexual activity: Not on file  Other Topics Concern   Not on file  Social History Narrative   Not on file   Social Determinants of Health   Financial Resource Strain: Not on file  Food Insecurity: Not on file  Transportation Needs: Not on file  Physical Activity: Not on file  Stress: Not on file  Social Connections: Not on file   Review of systems General: negative for malaise, night sweats, fever, chills, +weight loss Neck: Negative for lumps, goiter, pain and significant neck swelling Resp: Negative for cough, wheezing, dyspnea at rest CV: Negative for chest pain, leg swelling, palpitations, orthopnea GI: denies melena, hematochezia, nausea, vomiting, diarrhea, odynophagia, early satiety or unintentional weight loss. +dysphagia +weight loss +constipation MSK: Negative for joint pain or swelling, back pain, and muscle pain. Derm: Negative for itching or rash Psych: Denies depression, anxiety, memory loss, confusion. No homicidal or suicidal ideation.  Heme: Negative for prolonged bleeding, bruising easily, and  swollen nodes. Endocrine: Negative for cold or heat intolerance, polyuria, polydipsia and goiter. Neuro: negative for tremor, gait imbalance, syncope and seizures. The remainder of the review of systems is noncontributory.  Physical Exam: BP (!) 149/78 (BP Location: Left Arm, Patient Position: Sitting, Cuff Size: Normal)   Pulse 75   Temp 97.7 F (36.5 C) (Oral)   Ht '5\' 9"'$  (1.753 m)   Wt 147 lb 14.4 oz (67.1 kg)   BMI 21.84 kg/m  General:   Alert and oriented. No distress noted. Pleasant and cooperative.  Head:  Normocephalic and atraumatic. Eyes:  Conjuctiva clear without scleral icterus. Mouth:  Oral mucosa pink and moist. Good dentition. No lesions. Heart: Normal  rate and rhythm, s1 and s2 heart sounds present.  Lungs: Clear lung sounds in all lobes. Respirations equal and unlabored. Abdomen:  +BS, soft, non-tender and non-distended. No rebound or guarding. No HSM or masses noted. Derm: No palmar erythema or jaundice Msk:  Symmetrical without gross deformities. Normal posture. Extremities:  Without edema. Neurologic:  Alert and  oriented x4 Psych:  Alert and cooperative. Normal mood and affect.  Invalid input(s): "6 MONTHS"   ASSESSMENT: Wesley Reynolds is a 88 y.o. male presenting today as a new patient for dysphagia.   Dysphagia, mostly with larger pills for the past 3 months, noting he can lean back some and pills will usually pass, sometimes has issues with foods such as roughage like lettuce, tries to chew very thoroughly with breads and meats to avoid choking, no episodes of food impaction. He denies heartburn or acid regurgitation since PCP increased PPI to BID, but notes more belching over the past few months as well. Denies abdominal pain, nausea, vomiting, early satiety or melena. Recommend proceeding with EGD for further evaluation as we cannot rule out esophageal ring, web, stricture, stenosis, or malignancy. Will continue with pantoprazole BID at this time. He should  keep track of intake and upper GI symptoms as belching likely be secondary to dietary intake, further considerations to change PPI after EGD, depending on findings.  In regards to weight loss, appears to have been gradual over the past few years, notably with somewhat of decreased appetite and reportedly eating smaller portions. He has also had multiple family losses/stressors over the past few years which could certainly have contributed. Colonoscopy in 2019 with diverticulosis and hemorrhoids. TSH in March 2.310, Cortisol levels 13.7. PET scan done by urology in regards to prostate cancer, in December 2022 without any findings of metastasis noted from prostate adenocarcinoma. Will continue to monitor weight, may need to consider further evaluation if weight loss continues.    PLAN:  Continue PPI BID 2.Schedule EGD  3. Keep track of Upper GI symptoms and correlation with foods eaten 4. Chewing precautions 5. Will continue to monitor weight loss  All questions were answered, patient verbalized understanding and is in agreement with plan as outlined above.   Follow Up: 3 months  Angeleena Dueitt L. Alver Sorrow, MSN, APRN, AGNP-C Adult-Gerontology Nurse Practitioner [facility]

## 2022-05-01 NOTE — Progress Notes (Signed)
Referring Provider: Sharilyn Sites, MD Primary Care Physician:  Sharilyn Sites, MD Primary GI Physician: new  Chief Complaint  Patient presents with   Dysphagia    Referred for dysphagia. States he has trouble swallowing pills and water. Has a lot of belching.    HPI:   Wesley Reynolds is a 88 y.o. male with past medical history of BPH, GI bleed, HTN, prostate adenocarcinoma (dx 2019).   Patient presenting today as a new patient for dysphagia.   Last labs in march 2023 with normal renal function, LFTs, Hgb and WBC.  He reports that he has been having issues with pills going down for about 3 months. Usually larger pills. Sometimes certain foods such as lettuce, sometimes meats unless he chews them up really well. Dysphagia episodes occur almost every time he swallows a larger pill. He reports that he can usually lean back a little and the pills will go down. Feels that pills stick below the xyphoid process. Denies any issues with liquids. He is belching more for the past few months. PCP had him on pantoprazole '40mg'$  daily, had been on this for some time, he was increased to twice a day about 3 months ago. He reports some improvement in reflux prior to this.  He did not notice much improvement in belching with increase in PPI dosing. Belching seems to be related to certain sauces and foods. Denies nausea, vomiting, abdominal pain. He notes that he does not tend to eat very large meals.   Patient states he was previously working out and eating very healthy in his early 51s, he did this for a few years then with the pandemic he was unable to continue working out at [employer]. He has continued to have gradual weight loss since his early 74s.  Per chart review he was around 160lbs at the end of 2021. He continues to work out some but not as much as previously, and notes that diet is not as strict as before, but typically tries to eat pretty healthy, doing usually a meat and two veggies, however, eats  smaller meals as to not overload his stomach. He does not that his wife had a stroke about 6 months ago which put a lot of stress on him, also notes multiple family losses over the past few years.   Typically has a BM daily, if he goes 2 days without one he will take a laxative, takes metamucil daily since starting finasteride as this caused him some constipation, no rectal bleeding, melena or stool changes otherwise.   NSAID ZDG:UYQI Social hx:no etoh or tobacco Fam hx: no significant GI hx  Last Colonoscopy: 06/2018- Diverticulosis in the entire examined colon. - Internal hemorrhoids. No specimens Last Endoscopy:never  Recommendations:  No repeat colonoscopy  Past Medical History:  Diagnosis Date   BPH (benign prostatic hyperplasia)    GI bleed 11/2014   Hypertension     Past Surgical History:  Procedure Laterality Date   APPENDECTOMY     CHOLECYSTECTOMY     COLONOSCOPY N/A 03/26/2013   Procedure: COLONOSCOPY;  Surgeon: Rogene Houston, MD;  Location: [facility];  Service: Endoscopy;  Laterality: N/A;  830   COLONOSCOPY N/A 06/25/2018   Procedure: COLONOSCOPY;  Surgeon: Rogene Houston, MD;  Location: [facility];  Service: Endoscopy;  Laterality: N/A;  200   CYST EXCISION  1960 ?   spine     Current Outpatient Medications  Medication Sig Dispense Refill   Cyanocobalamin (  B-12 PO) Take by mouth. 2500 mg daily     Ensure (ENSURE) Take 237 mLs by mouth. One daily     finasteride (PROSCAR) 5 MG tablet Take 1 tablet (5 mg total) by mouth daily. 90 tablet 3   ipratropium (ATROVENT) 0.03 % nasal spray Place 2 sprays into both nostrils 3 (three) times daily.     Multiple Vitamins-Minerals (MULTIVITAMINS THER. W/MINERALS) TABS Take 1 tablet by mouth daily.     NIFEdipine (PROCARDIA-XL/ADALAT-CC/NIFEDICAL-XL) 30 MG 24 hr tablet Take 30 mg by mouth 2 (two) times daily.  0   Omega-3 Fatty Acids (FISH OIL) 1200 MG CAPS Take 1,200 mg by mouth daily.     pantoprazole (PROTONIX) 40  MG tablet Take 40 mg by mouth 2 (two) times daily.     triamcinolone cream (KENALOG) 0.1 % Apply topically.     zinc gluconate 50 MG tablet Take 50 mg by mouth daily.     zolpidem (AMBIEN) 10 MG tablet Take 10 mg by mouth at bedtime as needed.     No current facility-administered medications for this visit.    Allergies as of 05/01/2022   (No Known Allergies)    Family History  Problem Relation Age of Onset   Cancer Brother     Social History   Socioeconomic History   Marital status: Married    Spouse name: Not on file   Number of children: Not on file   Years of education: Not on file   Highest education level: Not on file  Occupational History   Not on file  Tobacco Use   Smoking status: Never    Passive exposure: Past   Smokeless tobacco: Never  Vaping Use   Vaping Use: Never used  Substance and Sexual Activity   Alcohol use: No   Drug use: Never   Sexual activity: Not on file  Other Topics Concern   Not on file  Social History Narrative   Not on file   Social Determinants of Health   Financial Resource Strain: Not on file  Food Insecurity: Not on file  Transportation Needs: Not on file  Physical Activity: Not on file  Stress: Not on file  Social Connections: Not on file   Review of systems General: negative for malaise, night sweats, fever, chills, +weight loss Neck: Negative for lumps, goiter, pain and significant neck swelling Resp: Negative for cough, wheezing, dyspnea at rest CV: Negative for chest pain, leg swelling, palpitations, orthopnea GI: denies melena, hematochezia, nausea, vomiting, diarrhea, odynophagia, early satiety or unintentional weight loss. +dysphagia +weight loss +constipation MSK: Negative for joint pain or swelling, back pain, and muscle pain. Derm: Negative for itching or rash Psych: Denies depression, anxiety, memory loss, confusion. No homicidal or suicidal ideation.  Heme: Negative for prolonged bleeding, bruising easily, and  swollen nodes. Endocrine: Negative for cold or heat intolerance, polyuria, polydipsia and goiter. Neuro: negative for tremor, gait imbalance, syncope and seizures. The remainder of the review of systems is noncontributory.  Physical Exam: BP (!) 149/78 (BP Location: Left Arm, Patient Position: Sitting, Cuff Size: Normal)   Pulse 75   Temp 97.7 F (36.5 C) (Oral)   Ht '5\' 9"'$  (1.753 m)   Wt 147 lb 14.4 oz (67.1 kg)   BMI 21.84 kg/m  General:   Alert and oriented. No distress noted. Pleasant and cooperative.  Head:  Normocephalic and atraumatic. Eyes:  Conjuctiva clear without scleral icterus. Mouth:  Oral mucosa pink and moist. Good dentition. No lesions. Heart: Normal  rate and rhythm, s1 and s2 heart sounds present.  Lungs: Clear lung sounds in all lobes. Respirations equal and unlabored. Abdomen:  +BS, soft, non-tender and non-distended. No rebound or guarding. No HSM or masses noted. Derm: No palmar erythema or jaundice Msk:  Symmetrical without gross deformities. Normal posture. Extremities:  Without edema. Neurologic:  Alert and  oriented x4 Psych:  Alert and cooperative. Normal mood and affect.  Invalid input(s): "6 MONTHS"   ASSESSMENT: Wesley Reynolds is a 88 y.o. male presenting today as a new patient for dysphagia.   Dysphagia, mostly with larger pills for the past 3 months, noting he can lean back some and pills will usually pass, sometimes has issues with foods such as roughage like lettuce, tries to chew very thoroughly with breads and meats to avoid choking, no episodes of food impaction. He denies heartburn or acid regurgitation since PCP increased PPI to BID, but notes more belching over the past few months as well. Denies abdominal pain, nausea, vomiting, early satiety or melena. Recommend proceeding with EGD for further evaluation as we cannot rule out esophageal ring, web, stricture, stenosis, or malignancy. Will continue with pantoprazole BID at this time. He should  keep track of intake and upper GI symptoms as belching likely be secondary to dietary intake, further considerations to change PPI after EGD, depending on findings.  In regards to weight loss, appears to have been gradual over the past few years, notably with somewhat of decreased appetite and reportedly eating smaller portions. He has also had multiple family losses/stressors over the past few years which could certainly have contributed. Colonoscopy in 2019 with diverticulosis and hemorrhoids. TSH in March 2.310, Cortisol levels 13.7. PET scan done by urology in regards to prostate cancer, in December 2022 without any findings of metastasis noted from prostate adenocarcinoma. Will continue to monitor weight, may need to consider further evaluation if weight loss continues.    PLAN:  Continue PPI BID 2.Schedule EGD  3. Keep track of Upper GI symptoms and correlation with foods eaten 4. Chewing precautions 5. Will continue to monitor weight loss  All questions were answered, patient verbalized understanding and is in agreement with plan as outlined above.   Follow Up: 3 months  Sharad Vaneaton L. Alver Sorrow, MSN, APRN, AGNP-C Adult-Gerontology Nurse Practitioner [facility]

## 2022-05-16 ENCOUNTER — Other Ambulatory Visit: Payer: Self-pay

## 2022-05-16 ENCOUNTER — Ambulatory Visit (HOSPITAL_BASED_OUTPATIENT_CLINIC_OR_DEPARTMENT_OTHER): Payer: Medicare Other | Admitting: Anesthesiology

## 2022-05-16 ENCOUNTER — Ambulatory Visit (HOSPITAL_COMMUNITY)
Admission: RE | Admit: 2022-05-16 | Discharge: 2022-05-16 | Disposition: A | Discharge status: Home / Self Care | Payer: Medicare Other | Attending: Gastroenterology | Admitting: Gastroenterology

## 2022-05-16 ENCOUNTER — Encounter (HOSPITAL_COMMUNITY): Payer: Self-pay | Admitting: Gastroenterology

## 2022-05-16 ENCOUNTER — Encounter (HOSPITAL_COMMUNITY)
Admission: RE | Disposition: A | Discharge status: Home / Self Care | Payer: Self-pay | Source: Home / Self Care | Attending: Gastroenterology

## 2022-05-16 ENCOUNTER — Ambulatory Visit (HOSPITAL_COMMUNITY): Payer: Medicare Other | Admitting: Anesthesiology

## 2022-05-16 DIAGNOSIS — R449 Unspecified symptoms and signs involving general sensations and perceptions: Secondary | ICD-10-CM | POA: Diagnosis not present

## 2022-05-16 DIAGNOSIS — I10 Essential (primary) hypertension: Secondary | ICD-10-CM | POA: Diagnosis not present

## 2022-05-16 DIAGNOSIS — R1310 Dysphagia, unspecified: Secondary | ICD-10-CM | POA: Diagnosis not present

## 2022-05-16 DIAGNOSIS — Z79899 Other long term (current) drug therapy: Secondary | ICD-10-CM | POA: Diagnosis not present

## 2022-05-16 DIAGNOSIS — K449 Diaphragmatic hernia without obstruction or gangrene: Secondary | ICD-10-CM | POA: Diagnosis not present

## 2022-05-16 DIAGNOSIS — E43 Unspecified severe protein-calorie malnutrition: Secondary | ICD-10-CM | POA: Diagnosis not present

## 2022-05-16 DIAGNOSIS — R1319 Other dysphagia: Secondary | ICD-10-CM

## 2022-05-16 DIAGNOSIS — N400 Enlarged prostate without lower urinary tract symptoms: Secondary | ICD-10-CM | POA: Insufficient documentation

## 2022-05-16 HISTORY — PX: SAVORY DILATION: SHX5439

## 2022-05-16 HISTORY — PX: ESOPHAGOGASTRODUODENOSCOPY (EGD) WITH PROPOFOL: SHX5813

## 2022-05-16 SURGERY — ESOPHAGOGASTRODUODENOSCOPY (EGD) WITH PROPOFOL
Anesthesia: General

## 2022-05-16 MED ORDER — PROPOFOL 500 MG/50ML IV EMUL
INTRAVENOUS | Status: DC | PRN
  Administered 2022-05-16: 150 ug/kg/min via INTRAVENOUS

## 2022-05-16 MED ORDER — LIDOCAINE HCL (CARDIAC) PF 100 MG/5ML IV SOSY
PREFILLED_SYRINGE | INTRAVENOUS | Status: DC | PRN
  Administered 2022-05-16: 40 mg via INTRAVENOUS

## 2022-05-16 MED ORDER — STERILE WATER FOR IRRIGATION IR SOLN
Status: DC | PRN
  Administered 2022-05-16: 60 mL

## 2022-05-16 MED ORDER — LACTATED RINGERS IV SOLN
INTRAVENOUS | Status: DC
Start: 2022-05-16 — End: 2022-05-16
  Administered 2022-05-16: 1000 mL via INTRAVENOUS

## 2022-05-16 NOTE — Transfer of Care (Signed)
Immediate Anesthesia Transfer of Care Note  Patient: Wesley Reynolds  Procedure(s) Performed: ESOPHAGOGASTRODUODENOSCOPY (EGD) WITH PROPOFOL SAVORY DILATION  Patient Location: Endoscopy Unit  Anesthesia Type:MAC  Level of Consciousness: awake  Airway & Oxygen Therapy: Patient Spontanous Breathing and Patient connected to nasal cannula oxygen  Post-op Assessment: Report given to RN and Post -op Vital signs reviewed and stable  Post vital signs: Reviewed and stable  Last Vitals:  Vitals Value Taken Time  BP    Temp    Pulse    Resp    SpO2      Last Pain:  Vitals:   05/16/22 1215  TempSrc:   PainSc: 0-No pain      Patients Stated Pain Goal: 7 (00/76/22 6333)  Complications: No notable events documented.

## 2022-05-16 NOTE — Discharge Instructions (Signed)
You are being discharged to home.  Resume your previous diet.  Continue your present medications.  Consider MBS if recurrent dysphagia.

## 2022-05-16 NOTE — Op Note (Signed)
[facility] Patient Name: Wesley Reynolds Procedure Date: 05/16/2022 11:51 AM MRN: [id number] Date of Birth: 28-Apr-1931 Attending MD: Maylon Peppers ,  CSN: [id number] Age: 88 Admit Type: Outpatient Procedure:                Upper GI endoscopy Indications:              Dysphagia Providers:                Maylon Peppers, Caprice Kluver, Ladoris Gene,                            Technician, Randa Spike, Technician Referring MD:              Medicines:                Monitored Anesthesia Care Complications:            No immediate complications. Estimated Blood Loss:     Estimated blood loss: none. Procedure:                Pre-Anesthesia Assessment:                           - Prior to the procedure, a History and Physical                            was performed, and patient medications, allergies                            and sensitivities were reviewed. The patient's                            tolerance of previous anesthesia was reviewed.                           - The risks and benefits of the procedure and the                            sedation options and risks were discussed with the                            patient. All questions were answered and informed                            consent was obtained.                           - ASA Grade Assessment: III - A patient with severe                            systemic disease.                           After obtaining informed consent, the endoscope was                            passed under direct vision. Throughout the  procedure, the patient's blood pressure, pulse, and                            oxygen saturations were monitored continuously. The                            GIF-H190 ([id number]) scope was introduced through the                            mouth, and advanced to the second part of duodenum.                            The upper GI endoscopy was accomplished without                             difficulty. The patient tolerated the procedure                            well. Scope In: 12:21:39 PM Scope Out: 12:28:55 PM Total Procedure Duration: 0 hours 7 minutes 16 seconds  Findings:      No endoscopic abnormality was evident in the esophagus to explain the       patient's complaint of dysphagia. It was decided, however, to proceed       with dilation of the entire esophagus. A guidewire was placed and the       scope was withdrawn. Dilation was performed with a Savary dilator with       no resistance at 18 mm. No mucosal disruption was seen upon reinspection.      A 3 cm hiatal hernia was present.      The stomach was normal.      The examined duodenum was normal. Impression:               - No endoscopic esophageal abnormality to explain                            patient's dysphagia. Esophagus dilated. Dilated.                           - 3 cm hiatal hernia.                           - Normal stomach.                           - Normal examined duodenum.                           - No specimens collected. Moderate Sedation:      Per Anesthesia Care Recommendation:           - Discharge patient to home (ambulatory).                           - Resume previous diet.                           - Continue  present medications.                           - Consider MBS if recurrent dysphagia. Procedure Code(s):        --- Professional ---                           [id number], Esophagogastroduodenoscopy, flexible,                            transoral; with insertion of guide wire followed by                            passage of dilator(s) through esophagus over guide                            wire Diagnosis Code(s):        --- Professional ---                           R13.10, Dysphagia, unspecified                           K44.9, Diaphragmatic hernia without obstruction or                            gangrene CPT copyright 2019 American Medical Association. All rights  reserved. The codes documented in this report are preliminary and upon coder review may  be revised to meet current compliance requirements. Maylon Peppers, MD Maylon Peppers,  05/16/2022 12:39:10 PM This report has been signed electronically. Number of Addenda: 0

## 2022-05-16 NOTE — Anesthesia Preprocedure Evaluation (Signed)
Anesthesia Evaluation  Patient identified by MRN, date of birth, ID band Patient awake    Reviewed: Allergy & Precautions, H&P , NPO status , Patient's Chart, lab work & pertinent test results, reviewed documented beta blocker date and time   Airway Mallampati: II  TM Distance: >3 FB Neck ROM: full    Dental no notable dental hx.    Pulmonary neg pulmonary ROS,    Pulmonary exam normal breath sounds clear to auscultation       Cardiovascular Exercise Tolerance: Good hypertension, negative cardio ROS   Rhythm:regular Rate:Normal     Neuro/Psych negative neurological ROS  negative psych ROS   GI/Hepatic negative GI ROS, Neg liver ROS,   Endo/Other  negative endocrine ROS  Renal/GU negative Renal ROS  negative genitourinary   Musculoskeletal   Abdominal   Peds  Hematology negative hematology ROS (+)   Anesthesia Other Findings   Reproductive/Obstetrics negative OB ROS                             Anesthesia Physical Anesthesia Plan  ASA: 2  Anesthesia Plan: General   Post-op Pain Management:    Induction:   PONV Risk Score and Plan: Propofol infusion  Airway Management Planned:   Additional Equipment:   Intra-op Plan:   Post-operative Plan:   Informed Consent: I have reviewed the patients History and Physical, chart, labs and discussed the procedure including the risks, benefits and alternatives for the proposed anesthesia with the patient or authorized representative who has indicated his/her understanding and acceptance.     Dental Advisory Given  Plan Discussed with: CRNA  Anesthesia Plan Comments:         Anesthesia Quick Evaluation

## 2022-05-16 NOTE — Interval H&P Note (Signed)
History and Physical Interval Note:  05/16/2022 11:16 AM  [name]  has presented today for surgery, with the diagnosis of Dysphagia.  The various methods of treatment have been discussed with the patient and family. After consideration of risks, benefits and other options for treatment, the patient has consented to  Procedure(s) with comments: ESOPHAGOGASTRODUODENOSCOPY (EGD) WITH PROPOFOL (N/A) - 1230 ASA 1 as a surgical intervention.  The patient's history has been reviewed, patient examined, no change in status, stable for surgery.  I have reviewed the patient's chart and labs.  Questions were answered to the patient's satisfaction.     Wesley Reynolds

## 2022-05-18 NOTE — Anesthesia Postprocedure Evaluation (Signed)
Anesthesia Post Note  Patient: Wesley Reynolds  Procedure(s) Performed: ESOPHAGOGASTRODUODENOSCOPY (EGD) WITH PROPOFOL SAVORY DILATION  Patient location during evaluation: Phase II Anesthesia Type: General Level of consciousness: awake Pain management: pain level controlled Vital Signs Assessment: post-procedure vital signs reviewed and stable Respiratory status: spontaneous breathing and respiratory function stable Cardiovascular status: blood pressure returned to baseline and stable Postop Assessment: no headache and no apparent nausea or vomiting Anesthetic complications: no Comments: Late entry   No notable events documented.   Last Vitals:  Vitals:   05/16/22 1141 05/16/22 1232  BP: (!) 158/66 (!) 115/49  Pulse: 83 82  Resp: 18 20  Temp: 36.5 C   SpO2: 99% 94%    Last Pain:  Vitals:   05/16/22 1242  TempSrc:   PainSc: 0-No pain                 Louann Sjogren

## 2022-05-22 ENCOUNTER — Encounter (HOSPITAL_COMMUNITY): Payer: Self-pay | Admitting: Gastroenterology

## 2022-06-11 DIAGNOSIS — Z6823 Body mass index (BMI) 23.0-23.9, adult: Secondary | ICD-10-CM | POA: Diagnosis not present

## 2022-06-11 DIAGNOSIS — R1310 Dysphagia, unspecified: Secondary | ICD-10-CM | POA: Diagnosis not present

## 2022-06-11 DIAGNOSIS — K219 Gastro-esophageal reflux disease without esophagitis: Secondary | ICD-10-CM | POA: Diagnosis not present

## 2022-06-20 ENCOUNTER — Telehealth: Payer: Self-pay | Admitting: *Deleted

## 2022-06-20 NOTE — Patient Outreach (Signed)
  Care Coordination   06/20/2022 Name: Wesley Reynolds MRN: [id number] DOB: [birth year]   Care Coordination Outreach Attempts:  An unsuccessful telephone outreach was attempted today to offer the patient information about available care coordination services as a benefit of their health plan.   Follow Up Plan:  Additional outreach attempts will be made to offer the patient care coordination information and services.   Encounter Outcome:  No Answer  Care Coordination Interventions Activated:  No   Care Coordination Interventions:  No, not indicated    Chong Sicilian, BSN, RN-BC [place] [facility] Pharmacist, community D[place]: [phone]

## 2022-06-26 ENCOUNTER — Telehealth: Payer: Self-pay | Admitting: *Deleted

## 2022-06-26 NOTE — Patient Outreach (Signed)
  Care Coordination   06/26/2022 Name: Wesley Reynolds MRN: [id number] DOB: 07-Feb-1931   Care Coordination Outreach Attempts:  A second unsuccessful outreach was attempted today to offer the patient with information about available care coordination services as a benefit of their health plan.     Follow Up Plan:  Additional outreach attempts will be made to offer the patient care coordination information and services.   Encounter Outcome:  No Answer  Care Coordination Interventions Activated:  No   Care Coordination Interventions:  No, not indicated    Chong Sicilian, BSN, RN-BC RN Care Coordinator [place]: [phone] Main #: [phone]

## 2022-07-04 ENCOUNTER — Other Ambulatory Visit: Payer: Medicare Other

## 2022-07-04 DIAGNOSIS — C61 Malignant neoplasm of prostate: Secondary | ICD-10-CM | POA: Diagnosis not present

## 2022-07-04 DIAGNOSIS — R972 Elevated prostate specific antigen [PSA]: Secondary | ICD-10-CM | POA: Diagnosis not present

## 2022-07-06 LAB — PSA: Prostate Specific Ag, Serum: 7.8 ng/mL — ABNORMAL HIGH (ref 0.0–4.0)

## 2022-07-12 ENCOUNTER — Encounter: Payer: Self-pay | Admitting: Urology

## 2022-07-12 ENCOUNTER — Ambulatory Visit (INDEPENDENT_AMBULATORY_CARE_PROVIDER_SITE_OTHER): Payer: Medicare Other | Admitting: Urology

## 2022-07-12 VITALS — BP 134/67 | HR 74

## 2022-07-12 DIAGNOSIS — N401 Enlarged prostate with lower urinary tract symptoms: Secondary | ICD-10-CM | POA: Diagnosis not present

## 2022-07-12 DIAGNOSIS — R3912 Poor urinary stream: Secondary | ICD-10-CM | POA: Diagnosis not present

## 2022-07-12 DIAGNOSIS — C61 Malignant neoplasm of prostate: Secondary | ICD-10-CM | POA: Diagnosis not present

## 2022-07-12 DIAGNOSIS — K219 Gastro-esophageal reflux disease without esophagitis: Secondary | ICD-10-CM | POA: Diagnosis not present

## 2022-07-12 DIAGNOSIS — R972 Elevated prostate specific antigen [PSA]: Secondary | ICD-10-CM | POA: Diagnosis not present

## 2022-07-12 DIAGNOSIS — Z6822 Body mass index (BMI) 22.0-22.9, adult: Secondary | ICD-10-CM | POA: Diagnosis not present

## 2022-07-12 DIAGNOSIS — N138 Other obstructive and reflux uropathy: Secondary | ICD-10-CM

## 2022-07-12 DIAGNOSIS — R351 Nocturia: Secondary | ICD-10-CM | POA: Diagnosis not present

## 2022-07-12 DIAGNOSIS — Z23 Encounter for immunization: Secondary | ICD-10-CM | POA: Diagnosis not present

## 2022-07-12 LAB — POCT URINALYSIS DIPSTICK
Blood, UA: NEGATIVE
Glucose, UA: NEGATIVE
Ketones, UA: NEGATIVE
Leukocytes, UA: NEGATIVE
Nitrite, UA: NEGATIVE
Protein, UA: NEGATIVE
Spec Grav, UA: 1.025 (ref 1.010–1.025)
Urobilinogen, UA: 0.2 E.U./dL
pH, UA: 5 (ref 5.0–8.0)

## 2022-07-12 MED ORDER — FINASTERIDE 5 MG PO TABS
5.0000 mg | ORAL_TABLET | Freq: Every day | ORAL | 3 refills | Status: DC
Start: 2022-07-12 — End: 2022-08-27

## 2022-07-12 NOTE — Progress Notes (Signed)
Subjective:  1. Prostate cancer ([place])   2. Elevated PSA   3. BPH with urinary obstruction   4. Weak urinary stream   5. Nocturia       I have prostate cancer.  07/12/22: Wesley Reynolds returns today in f/u for his history of GG1 prostate cancer on surveillance.  He remains on surveillance with a further decline in the PSA to 7.8 prior to this visit.  He is on finasteride for BPH with BOO.  He has been hydrating more with some increased frequency.   His IPSS is 8 with nocturia x 2.  He has no weight loss.  He has no bone pain.   He had an upper endo for dysphagia on 8/2 and that was negative.    04/12/22: Wesley Reynolds returns today in f/u for his history of Gleason 6 prostate cancer on surveillance.  He remains on finasteride for BPH with BOO and his PSA has fallen further to 8.7.  His IPSS is 8.  He is voiding ok with nocutria x 2.  He has no side effects with the med.    12/21/21: Wesley Reynolds returns today in f/u.  He has been taking the finasteride and his PSA is down to 10.7 from 36.7.   A PMSA PET on 10/05/21 showed only local prostate uptake.  He is voiding with an IPSS of 14.  He has some intermittency.  He has nocturia x 2.  He has a reduced stream.   He is happy with the response to finasteride.  He has no weight loss or bone pain.     09/14/21: Wesley Reynolds returns today in f/u.  His PSA is up further to 36.7 from 35.9 on 06/01/21 and 34 03/07/21.   He lost his son in law a few months ago and then his daughter more recently from liver cancer.  He has moderate LUTS with an IPSS of 11 and nocturia x 2.   He has some increased urgency with increased water intake.   He was placed on Augmentin for URI symptoms.  He has no bone pain or weight loss.    06/08/21: Wesley Reynolds returns today in f/u for the history noted below.   He was given finasteride at his last visit but never started it.  His PSA continues to rise slowly and was 35.9 on 06/01/21 from 34 on 03/07/21.  His has stable LUTS and his IPSS is 5 with nocturia x 2.    03/09/21: Wesley Reynolds returns today for his history of locally progressive prostate cancer.  His PSA continues to rise but relatively slowly and is up to 34 from 32.5 in 10/21.  The PSMA PET in 10/21 only showed local uptake.  He has mild LUTS with nocturia x 2.  His UA is clear. His IPSS is 4.   10/20/20: Wesley Reynolds returns today in f/u to discuss the results of the PSMA PET scan done on 08/04/20.  The study showed intense uptake in the prostate but no metastatic disease.   He is well with and IPSS of 5.  He had some numbness in the left arm last week when he leans up against a couch arm but it resolves when he lifts it up.  His weight is stable and he has no bone pain. He had bloodwork this week and I don't have the results.     07/22/20:  Wesley Reynolds returns today in f/u for his history of prostate cancer. He has intermediate risk disease on his  biopsy on 03/17/18.  His PSA prior to this visit is up to 32.5 from  23.6 on 11/16/19 with the prior PSA of 22.9 on 08/07/19 which was stable over the last 3 months and in his usual range for the last 24 months.  He had an Axumin PET on 04/15/18 that showed only prostate uptake. His last biopsy was an MRI fusion biopsy on 03/17/18. He had a total 19 biopsied done. There was Gleason 6 in 30% of a single core from the ROI1 in the right TZ out of the 4 biopsies. The 3 biopsies from the left PZ ROI were negative. he had 2 cores on the left with 5% Gleason 6 and a single core at the right apex with Gleason 7(3+4) in 40%. His prostate is 62m. On the MRI there was several sclerotic lesions in the pelvic bones that are suspicious for mets but that was not confirmed on the PET. He was biopsied because the PSA had jumped up to 27 but it fell back to 18 prior to the biopsy and has otherwise been stable for the last 2.5-3 years. He is voiding well but has nocturia 2-3x and has some time going back to sleep.  He has some urgency as well.  He has no bone pain or weight loss. He has gained  some weight. His IPSS is 4. He has no associated signs or symptoms.      IPSS     Row Name 07/12/22 1100         International Prostate Symptom Score   How often have you had the sensation of not emptying your bladder? Less than half the time     How often have you had to urinate less than every two hours? Less than 1 in 5 times     How often have you found you stopped and started again several times when you urinated? Less than 1 in 5 times     How often have you found it difficult to postpone urination? Less than 1 in 5 times     How often have you had a weak urinary stream? Less than 1 in 5 times     How often have you had to strain to start urination? Not at All     How many times did you typically get up at night to urinate? 2 Times     Total IPSS Score 8       Quality of Life due to urinary symptoms   If you were to spend the rest of your life with your urinary condition just the way it is now how would you feel about that? Mixed              IPSS     Row Name 07/12/22 1100         International Prostate Symptom Score   How often have you had the sensation of not emptying your bladder? Less than half the time     How often have you had to urinate less than every two hours? Less than 1 in 5 times     How often have you found you stopped and started again several times when you urinated? Less than 1 in 5 times     How often have you found it difficult to postpone urination? Less than 1 in 5 times     How often have you had a weak urinary stream? Less than 1 in 5 times     How  often have you had to strain to start urination? Not at All     How many times did you typically get up at night to urinate? 2 Times     Total IPSS Score 8       Quality of Life due to urinary symptoms   If you were to spend the rest of your life with your urinary condition just the way it is now how would you feel about that? Mixed                   ROS:  ROS:  A complete review of  systems was performed.  All systems are negative except for pertinent findings as noted.   Review of Systems  All other systems reviewed and are negative.   No Known Allergies  Outpatient Encounter Medications as of 07/12/2022  Medication Sig   Cyanocobalamin (B-12 PO) Take by mouth. 2500 mg daily   Ensure (ENSURE) Take 237 mLs by mouth. One daily   finasteride (PROSCAR) 5 MG tablet Take 1 tablet (5 mg total) by mouth daily.   ipratropium (ATROVENT) 0.03 % nasal spray Place 2 sprays into both nostrils 3 (three) times daily.   Multiple Vitamins-Minerals (MULTIVITAMINS THER. W/MINERALS) TABS Take 1 tablet by mouth daily.   NIFEdipine (PROCARDIA-XL/ADALAT-CC/NIFEDICAL-XL) 30 MG 24 hr tablet Take 30 mg by mouth 2 (two) times daily.   Omega-3 Fatty Acids (FISH OIL) 1200 MG CAPS Take 1,200 mg by mouth daily.   pantoprazole (PROTONIX) 40 MG tablet Take 40 mg by mouth 2 (two) times daily.   triamcinolone cream (KENALOG) 0.1 % Apply topically.   zinc gluconate 50 MG tablet Take 50 mg by mouth daily.   zolpidem (AMBIEN) 10 MG tablet Take 10 mg by mouth at bedtime as needed.   [DISCONTINUED] finasteride (PROSCAR) 5 MG tablet Take 1 tablet (5 mg total) by mouth daily.   No facility-administered encounter medications on file as of 07/12/2022.    Past Medical History:  Diagnosis Date   BPH (benign prostatic hyperplasia)    GI bleed 11/2014   Hypertension     Past Surgical History:  Procedure Laterality Date   APPENDECTOMY     CHOLECYSTECTOMY     COLONOSCOPY N/A 03/26/2013   Procedure: COLONOSCOPY;  Surgeon: Rogene Houston, MD;  [facility];  Service: Endoscopy;  Laterality: N/A;  830   COLONOSCOPY N/A 06/25/2018   Procedure: COLONOSCOPY;  Surgeon: Rogene Houston, MD;  [facility];  Service: Endoscopy;  Laterality: N/A;  200   CYST EXCISION  1960 ?   spine    ESOPHAGOGASTRODUODENOSCOPY (EGD) WITH PROPOFOL N/A 05/16/2022   Procedure: ESOPHAGOGASTRODUODENOSCOPY (EGD)  WITH PROPOFOL;  Surgeon: Harvel Quale, MD;  [facility];  Service: Gastroenterology;  Laterality: N/A;  1230 ASA 1   SAVORY DILATION  05/16/2022   Procedure: SAVORY DILATION;  Surgeon: Montez Morita, Quillian Quince, MD;  [facility];  Service: Gastroenterology;;    Social History   Socioeconomic History   Marital status: Married    Spouse name: Not on file   Number of children: Not on file   Years of education: Not on file   Highest education level: Not on file  Occupational History   Not on file  Tobacco Use   Smoking status: Never    Passive exposure: Past   Smokeless tobacco: Never  Vaping Use   Vaping Use: Never used  Substance and Sexual Activity   Alcohol use:  No   Drug use: Never   Sexual activity: Not on file  Other Topics Concern   Not on file  Social History Narrative   Not on file   Social Determinants of Health   Financial Resource Strain: Not on file  Food Insecurity: Not on file  Transportation Needs: Not on file  Physical Activity: Not on file  Stress: Not on file  Social Connections: Not on file  Intimate Partner Violence: Not on file    Family History  Problem Relation Age of Onset   Cancer Brother        Objective: BP 134/67   Pulse 74     Physical Exam Vitals reviewed.  Constitutional:      Appearance: Normal appearance.  Genitourinary:    Comments: AP without lesions. Prostate 2+ benign. SV non-palpable.  Neurological:     Mental Status: He is alert.     Lab Results:  Results for orders placed or performed in visit on 07/12/22 (from the past 24 hour(s))  POCT urinalysis dipstick     Status: None   Collection Time: 07/12/22 11:10 AM  Result Value Ref Range   Color, UA yellow    Clarity, UA clear    Glucose, UA Negative Negative   Bilirubin, UA 1+    Ketones, UA neg    Spec Grav, UA 1.025 1.010 - 1.025   Blood, UA neg    pH, UA 5.0 5.0 - 8.0   Protein, UA Negative Negative    Urobilinogen, UA 0.2 0.2 or 1.0 E.U./dL   Nitrite, UA neg    Leukocytes, UA Negative Negative   Appearance     Odor        BMET No results for input(s): "NA", "K", "CL", "CO2", "GLUCOSE", "BUN", "CREATININE", "CALCIUM" in the last 72 hours. PSA Recent Results (from the past 2160 hour(s))  PSA     Status: Abnormal   Collection Time: 07/04/22 10:04 AM  Result Value Ref Range   Prostate Specific Ag, Serum 7.8 (H) 0.0 - 4.0 ng/mL    Comment: Roche ECLIA methodology. According to the American Urological Association, Serum PSA should decrease and remain at undetectable levels after radical prostatectomy. The AUA defines biochemical recurrence as an initial PSA value 0.2 ng/mL or greater followed by a subsequent confirmatory PSA value 0.2 ng/mL or greater. Values obtained with different assay methods or kits cannot be used interchangeably. Results cannot be interpreted as absolute evidence of the presence or absence of malignant disease.   POCT urinalysis dipstick     Status: None   Collection Time: 07/12/22 11:10 AM  Result Value Ref Range   Color, UA yellow    Clarity, UA clear    Glucose, UA Negative Negative   Bilirubin, UA 1+    Ketones, UA neg    Spec Grav, UA 1.025 1.010 - 1.025   Blood, UA neg    pH, UA 5.0 5.0 - 8.0   Protein, UA Negative Negative   Urobilinogen, UA 0.2 0.2 or 1.0 E.U./dL   Nitrite, UA neg    Leukocytes, UA Negative Negative   Appearance     Odor       PSA  Date Value Ref Range Status  11/16/2019 23.6 (H) < OR = 4.0 ng/mL Final    Comment:    The total PSA value from this assay system is  standardized against the WHO standard. The test  result will be approximately 20% lower when compared  to the equimolar-standardized total  PSA (Beckman  Coulter). Comparison of serial PSA results should be  interpreted with this fact in mind. . This test was performed using the Siemens  chemiluminescent method. Values obtained from  different assay  methods cannot be used interchangeably. PSA levels, regardless of value, should not be interpreted as absolute evidence of the presence or absence of disease.    Lab Results  Component Value Date   PSA1 7.8 (H) 07/04/2022   PSA1 8.7 (H) 04/05/2022   PSA1 10.7 (H) 12/14/2021    Results for orders placed or performed in visit on 07/12/22 (from the past 24 hour(s))  POCT urinalysis dipstick     Status: None   Collection Time: 07/12/22 11:10 AM  Result Value Ref Range   Color, UA yellow    Clarity, UA clear    Glucose, UA Negative Negative   Bilirubin, UA 1+    Ketones, UA neg    Spec Grav, UA 1.025 1.010 - 1.025   Blood, UA neg    pH, UA 5.0 5.0 - 8.0   Protein, UA Negative Negative   Urobilinogen, UA 0.2 0.2 or 1.0 E.U./dL   Nitrite, UA neg    Leukocytes, UA Negative Negative   Appearance     Odor      UA is clear.     No results found for: "TESTOSTERONE"    Studies/Results: No results found.    Assessment & Plan: Prostate cancer with a rising PSA but only local disease on the PSMA PET.   He has been on the finasteride without side effects and his PSA is down to 7.8.   I will have him return in 6 months with a PSA.   BPH with LUTs.  His symptoms have improved on the finasteride.   Med refilled.     Meds ordered this encounter  Medications   finasteride (PROSCAR) 5 MG tablet    Sig: Take 1 tablet (5 mg total) by mouth daily.    Dispense:  90 tablet    Refill:  3      Orders Placed This Encounter  Procedures   PSA    Standing Status:   Future    Standing Expiration Date:   07/13/2023   POCT urinalysis dipstick      Return in about 6 months (around 01/10/2023) for with a PSA.   CC: Sharilyn Sites, MD      Irine Seal 07/12/2022 Patient ID: Wesley Reynolds, male   DOB: July 30, 1931, 88 y.o.   MRN: [id number]

## 2022-07-19 DIAGNOSIS — J069 Acute upper respiratory infection, unspecified: Secondary | ICD-10-CM | POA: Diagnosis not present

## 2022-07-19 DIAGNOSIS — Z6822 Body mass index (BMI) 22.0-22.9, adult: Secondary | ICD-10-CM | POA: Diagnosis not present

## 2022-07-24 ENCOUNTER — Telehealth: Payer: Self-pay | Admitting: *Deleted

## 2022-07-24 NOTE — Patient Outreach (Signed)
  Care Coordination   07/24/2022 Name: Wesley Reynolds MRN: [id number] DOB: 25-May-1931   Care Coordination Outreach Attempts:  A third unsuccessful outreach was attempted today to offer the patient with information about available care coordination services as a benefit of their health plan.   Follow Up Plan:  No further outreach attempts will be made at this time. We have been unable to contact the patient to offer or enroll patient in care coordination services  Encounter Outcome:  No Answer  Care Coordination Interventions Activated:  No   Care Coordination Interventions:  No, not indicated    Valente David, RN, MSN, [facility] Care Management Care Management Coordinator [phone]

## 2022-08-02 ENCOUNTER — Ambulatory Visit (INDEPENDENT_AMBULATORY_CARE_PROVIDER_SITE_OTHER): Payer: Medicare Other | Admitting: Gastroenterology

## 2022-08-02 ENCOUNTER — Encounter (INDEPENDENT_AMBULATORY_CARE_PROVIDER_SITE_OTHER): Payer: Self-pay | Admitting: Gastroenterology

## 2022-08-02 VITALS — BP 150/75 | HR 58 | Temp 97.6°F | Ht 69.0 in | Wt 146.1 lb

## 2022-08-02 DIAGNOSIS — K219 Gastro-esophageal reflux disease without esophagitis: Secondary | ICD-10-CM

## 2022-08-02 DIAGNOSIS — R634 Abnormal weight loss: Secondary | ICD-10-CM

## 2022-08-02 NOTE — Patient Instructions (Signed)
It was nice to see you! Please continue pantoprazole '40mg'$  twice daily, continue to take small bites, chew thoroughly, take sips of liquids between bites  Avoid greasy, spicy, fried, citrus foods, and be mindful that caffeine, carbonated drinks, chocolate and alcohol can increase reflux symptoms Stay upright 2-3 hours after eating, prior to lying down and avoid eating late in the evenings.  Weight is stable today, we will continue to keep an eye on this as further weight loss may require more investigation.  Follow up 6 months

## 2022-08-02 NOTE — Progress Notes (Addendum)
Referring Provider: Sharilyn Sites, MD Primary Care Physician:  Sharilyn Sites, MD Primary GI Physician:   Chief Complaint  Patient presents with   Dysphagia    Patient here today for a follow up on his dysphagia. He says he is doing much better after the dilation and with the dietary changes he has made. He is still taking pantoprazole 40 mg bid.   HPI:   Wesley Reynolds is a 88 y.o. male with past medical history of BPH, GI bleed, HTN, prostate adenocarcinoma (dx 2019).   Patient presenting today for folloow up of dysphagia.  Last seen July 2023, at that time having dysphagia with pills x3 months, sometimes certain foods. Also with belching. Taking pantoprazole '40mg'$  BID. Recommended to continue PPI BID, schedule EGD. Monitor weight loss.   Present: States that swallowing is better since EGD. He is avoiding spicy foods and chewing well. He is taking PPI BID. Denies heartburn. Denies nausea or vomiting. No abdominal pain. Feels that appetite is good. He tries not to overeat. He is eating three good meals per day, usually evening meal is lighter. He has some intermittent constipation from his finasteride. Keeps a close eye on this,  If he goes two days without a BM he will take something otc for constipation he is unsure what the name of this is but has good results with it. No rectal bleeding or melena. Weight is stable today at 146 lbs, 147lbs at last visit. Previously with about 18 pounds weight loss but this was around the time his wife had a stroke and also had multiple family losses during that time.he does note appetite has gradually decreased some as he has gotten older but he feels that he is eating well. Doing ensure once daily   Last Colonoscopy: 06/2018- Diverticulosis in the entire examined colon. - Internal hemorrhoids. No specimens Last Endoscopy:05/16/22 - No endoscopic esophageal abnormality to explain patient's dysphagia. Esophagus dilated.  3 cm hiatal hernia - Normal  stomach. - Normal examined duodenum. - No specimens collected. Recommendations:    Past Medical History:  Diagnosis Date   BPH (benign prostatic hyperplasia)    GI bleed 11/2014   Hypertension     Past Surgical History:  Procedure Laterality Date   APPENDECTOMY     CHOLECYSTECTOMY     COLONOSCOPY N/A 03/26/2013   Procedure: COLONOSCOPY;  Surgeon: Rogene Houston, MD;  [facility];  Service: Endoscopy;  Laterality: N/A;  830   COLONOSCOPY N/A 06/25/2018   Procedure: COLONOSCOPY;  Surgeon: Rogene Houston, MD;  [facility];  Service: Endoscopy;  Laterality: N/A;  200   CYST EXCISION  1960 ?   spine    ESOPHAGOGASTRODUODENOSCOPY (EGD) WITH PROPOFOL N/A 05/16/2022   Procedure: ESOPHAGOGASTRODUODENOSCOPY (EGD) WITH PROPOFOL;  Surgeon: Harvel Quale, MD;  [facility];  Service: Gastroenterology;  Laterality: N/A;  1230 ASA 1   SAVORY DILATION  05/16/2022   Procedure: SAVORY DILATION;  Surgeon: Harvel Quale, MD;  [facility];  Service: Gastroenterology;;    Current Outpatient Medications  Medication Sig Dispense Refill   Cyanocobalamin (B-12 PO) Take by mouth. 2500 mg daily     Ensure (ENSURE) Take 237 mLs by mouth. One daily     finasteride (PROSCAR) 5 MG tablet Take 1 tablet (5 mg total) by mouth daily. 90 tablet 3   ipratropium (ATROVENT) 0.03 % nasal spray Place 2 sprays into both nostrils 3 (three) times daily.  Multiple Vitamins-Minerals (MULTIVITAMINS THER. W/MINERALS) TABS Take 1 tablet by mouth daily.     NIFEdipine (PROCARDIA-XL/ADALAT-CC/NIFEDICAL-XL) 30 MG 24 hr tablet Take 30 mg by mouth 2 (two) times daily.  0   Omega-3 Fatty Acids (FISH OIL) 1200 MG CAPS Take 1,200 mg by mouth daily.     pantoprazole (PROTONIX) 40 MG tablet Take 40 mg by mouth 2 (two) times daily.     zinc gluconate 50 MG tablet Take 50 mg by mouth daily.     zolpidem (AMBIEN) 10 MG tablet Take 10 mg by mouth at bedtime as needed.      No current facility-administered medications for this visit.    Allergies as of 08/02/2022   (No Known Allergies)    Family History  Problem Relation Age of Onset   Cancer Brother     Social History   Socioeconomic History   Marital status: Married    Spouse name: Not on file   Number of children: Not on file   Years of education: Not on file   Highest education level: Not on file  Occupational History   Not on file  Tobacco Use   Smoking status: Never    Passive exposure: Past   Smokeless tobacco: Never  Vaping Use   Vaping Use: Never used  Substance and Sexual Activity   Alcohol use: No   Drug use: Never   Sexual activity: Not on file  Other Topics Concern   Not on file  Social History Narrative   Not on file   Social Determinants of Health   Financial Resource Strain: Not on file  Food Insecurity: Not on file  Transportation Needs: Not on file  Physical Activity: Not on file  Stress: Not on file  Social Connections: Not on file   Review of systems General: negative for malaise, night sweats, fever, chills, weight loss Neck: Negative for lumps, goiter, pain and significant neck swelling Resp: Negative for cough, wheezing, dyspnea at rest CV: Negative for chest pain, leg swelling, palpitations, orthopnea GI: denies melena, hematochezia, nausea, vomiting, diarrhea, dysphagia, odyonophagia, early satiety or unintentional weight loss. +constipation MSK: Negative for joint pain or swelling, back pain, and muscle pain. Derm: Negative for itching or rash Psych: Denies depression, anxiety, memory loss, confusion. No homicidal or suicidal ideation.  Heme: Negative for prolonged bleeding, bruising easily, and swollen nodes. Endocrine: Negative for cold or heat intolerance, polyuria, polydipsia and goiter. Neuro: negative for tremor, gait imbalance, syncope and seizures. The remainder of the review of systems is noncontributory.  Physical Exam: BP (!) 150/75  (BP Location: Left Arm, Patient Position: Sitting, Cuff Size: Small)   Pulse (!) 58   Temp 97.6 F (36.4 C) (Oral)   Ht '5\' 9"'$  (1.753 m)   Wt 146 lb 1.6 oz (66.3 kg)   BMI 21.58 kg/m  General:   Alert and oriented. No distress noted. Pleasant and cooperative.  Head:  Normocephalic and atraumatic. Eyes:  Conjuctiva clear without scleral icterus. Mouth:  Oral mucosa pink and moist. Good dentition. No lesions. Heart: Normal rate and rhythm, s1 and s2 heart sounds present.  Lungs: Clear lung sounds in all lobes. Respirations equal and unlabored. Abdomen:  +BS, soft, non-tender and non-distended. No rebound or guarding. No HSM or masses noted. Derm: No palmar erythema or jaundice Msk:  Symmetrical without gross deformities. Normal posture. Extremities:  Without edema. Neurologic:  Alert and  oriented x4 Psych:  Alert and cooperative. Normal mood and affect.  Invalid input(s): "6 MONTHS"  ASSESSMENT: EDGER HUSAIN is a 88 y.o. male presenting today for follow up of dysphagia and weight loss.  Dysphagia has resolved since EGD with empiric dilation. He continues to take small bites and chew thoroughly. Is maintained on pantoprazole '40mg'$  BID, avoiding spicy foods and no issues with breakthrough GERD symptoms. Feels that appetite is good. He has no nausea or vomiting. Denies early satiety. Should continue with chewing precautions and PPI BID, avoiding reflux triggers.   In regards to weight loss, appears to have been gradual over the past few years, has remained somewhat stable for the past few months. He had multiple family losses/stressors over the past few years which could certainly have contributed. Colonoscopy in 2019 with diverticulosis and hemorrhoids. TSH in March 2.310, Cortisol levels 13.7. PET scan done by urology for hx of prostate cancer, in December 2022 without any findings of metastasis noted from prostate adenocarcinoma. Will continue to monitor weight, may need to consider  further evaluation if weight loss continues.   PLAN:  Chewing and reflux precautions  2. PPI BID  3. Continue ensure daily  4. Will continue to monitor for any further decline in weight loss  All questions were answered, patient verbalized understanding and is in agreement with plan as outlined above.    Follow Up: 6 months   Mazelle Huebert L. Alver Sorrow, MSN, APRN, AGNP-C Adult-Gerontology Nurse Practitioner [facility]  I have reviewed the note and agree with the APP's assessment as described in this progress note  Maylon Peppers, MD Gastroenterology and Hepatology [facility] Gastroenterology

## 2022-08-22 ENCOUNTER — Telehealth: Payer: Self-pay | Admitting: Orthopedic Surgery

## 2022-08-22 NOTE — Telephone Encounter (Signed)
Patient called, lvm requesting an appointment w/Dr. Aline Brochure for his knee.  I called the patient back and lvm to schedule.  Pt's # [phone].

## 2022-08-24 ENCOUNTER — Other Ambulatory Visit: Payer: Self-pay | Admitting: Urology

## 2022-08-27 DIAGNOSIS — H04123 Dry eye syndrome of bilateral lacrimal glands: Secondary | ICD-10-CM | POA: Diagnosis not present

## 2022-09-10 ENCOUNTER — Ambulatory Visit (INDEPENDENT_AMBULATORY_CARE_PROVIDER_SITE_OTHER): Payer: Medicare Other

## 2022-09-10 ENCOUNTER — Ambulatory Visit (INDEPENDENT_AMBULATORY_CARE_PROVIDER_SITE_OTHER): Payer: Medicare Other | Admitting: Orthopedic Surgery

## 2022-09-10 VITALS — BP 165/82 | HR 89 | Ht 69.0 in | Wt 148.0 lb

## 2022-09-10 DIAGNOSIS — M25562 Pain in left knee: Secondary | ICD-10-CM

## 2022-09-10 DIAGNOSIS — M1712 Unilateral primary osteoarthritis, left knee: Secondary | ICD-10-CM | POA: Diagnosis not present

## 2022-09-10 DIAGNOSIS — M23322 Other meniscus derangements, posterior horn of medial meniscus, left knee: Secondary | ICD-10-CM | POA: Diagnosis not present

## 2022-09-10 NOTE — Progress Notes (Signed)
Chief Complaint  Patient presents with   Knee Pain    Left about the last month when I twist a certain way it takes me down and has a warm feeling in it    This is an 88 year old male who complains of a 1 month history of left knee pain with associated mechanical symptoms when twisting.  He had 3 falls  He has medial joint line pain  Review of systems is negative for any numbness tingling weakness dizziness  Past Medical History:  Diagnosis Date   BPH (benign prostatic hyperplasia)    GI bleed 11/2014   Hypertension    BP (!) 165/82   Pulse 89   Ht '5\' 9"'$  (1.753 m)   Wt 148 lb (67.1 kg)   BMI 21.86 kg/m   Physical Exam Vitals and nursing note reviewed.  Constitutional:      General: He is not in acute distress.    Appearance: Normal appearance. He is not ill-appearing, toxic-appearing or diaphoretic.  HENT:     Head: Normocephalic and atraumatic.     Nose: Nose normal. No congestion or rhinorrhea.  Eyes:     General: No scleral icterus.       Right eye: No discharge.        Left eye: No discharge.     Extraocular Movements: Extraocular movements intact.     Conjunctiva/sclera: Conjunctivae normal.     Pupils: Pupils are equal, round, and reactive to light.  Cardiovascular:     Pulses: Normal pulses.  Pulmonary:     Effort: Pulmonary effort is normal.     Breath sounds: No wheezing.  Musculoskeletal:     Comments: Left knee medial joint line tenderness clicking with McMurray's maneuver and increased pain ligaments are stable no effusion he cannot extend all the way that is painful muscle tone is normal extensor mechanism intact  Skin:    General: Skin is warm and dry.     Capillary Refill: Capillary refill takes less than 2 seconds.     Coloration: Skin is not jaundiced.     Findings: No erythema.  Neurological:     General: No focal deficit present.     Mental Status: He is alert and oriented to person, place, and time.  Psychiatric:        Mood and Affect:  Mood normal.        Behavior: Behavior normal.        Thought Content: Thought content normal.        Judgment: Judgment normal.    In office imaging shows grade 3 arthritis with slight joint space narrowing  Differential diagnosis arthritis, loose body, medial meniscal tear   Encounter Diagnoses  Name Primary?   Acute pain of left knee Yes   Primary osteoarthritis of left knee    Derangement of posterior horn of medial meniscus of left knee     Recommend economy hinged brace  MRI to rule out meniscal tear

## 2022-09-27 ENCOUNTER — Ambulatory Visit (HOSPITAL_COMMUNITY)
Admission: RE | Admit: 2022-09-27 | Discharge: 2022-09-27 | Disposition: A | Discharge status: Home / Self Care | Payer: Medicare Other | Source: Ambulatory Visit | Attending: Orthopedic Surgery | Admitting: Orthopedic Surgery

## 2022-09-27 DIAGNOSIS — M7122 Synovial cyst of popliteal space [Baker], left knee: Secondary | ICD-10-CM | POA: Diagnosis not present

## 2022-09-27 DIAGNOSIS — M25562 Pain in left knee: Secondary | ICD-10-CM | POA: Insufficient documentation

## 2022-09-27 DIAGNOSIS — M1712 Unilateral primary osteoarthritis, left knee: Secondary | ICD-10-CM | POA: Diagnosis not present

## 2022-09-27 DIAGNOSIS — S83242A Other tear of medial meniscus, current injury, left knee, initial encounter: Secondary | ICD-10-CM | POA: Diagnosis not present

## 2022-10-03 ENCOUNTER — Encounter: Payer: Self-pay | Admitting: Orthopedic Surgery

## 2022-10-03 ENCOUNTER — Ambulatory Visit (INDEPENDENT_AMBULATORY_CARE_PROVIDER_SITE_OTHER): Payer: Medicare Other | Admitting: Orthopedic Surgery

## 2022-10-03 DIAGNOSIS — M1712 Unilateral primary osteoarthritis, left knee: Secondary | ICD-10-CM | POA: Diagnosis not present

## 2022-10-03 DIAGNOSIS — M23322 Other meniscus derangements, posterior horn of medial meniscus, left knee: Secondary | ICD-10-CM

## 2022-10-03 NOTE — Progress Notes (Signed)
Chief Complaint  Patient presents with   Results    Review MRI knee left    The patient comes in for review of his MRI.  I have already read it.  He has 3 compartment arthritis and a torn medial meniscus  The patient says with the brace on and activity modification he thinks he can go without the surgery for now  I discussed his MRI with him told him about the surgical options as well as the continued nonoperative treatment option which she chose  He will continue with [place] on an as-needed basis depending on the symptoms of his left knee

## 2022-11-02 DIAGNOSIS — E119 Type 2 diabetes mellitus without complications: Secondary | ICD-10-CM | POA: Diagnosis not present

## 2022-11-02 DIAGNOSIS — I10 Essential (primary) hypertension: Secondary | ICD-10-CM | POA: Diagnosis not present

## 2022-11-02 DIAGNOSIS — I739 Peripheral vascular disease, unspecified: Secondary | ICD-10-CM | POA: Diagnosis not present

## 2022-11-02 DIAGNOSIS — Z6823 Body mass index (BMI) 23.0-23.9, adult: Secondary | ICD-10-CM | POA: Diagnosis not present

## 2022-11-02 DIAGNOSIS — J329 Chronic sinusitis, unspecified: Secondary | ICD-10-CM | POA: Diagnosis not present

## 2022-11-14 DIAGNOSIS — K219 Gastro-esophageal reflux disease without esophagitis: Secondary | ICD-10-CM | POA: Diagnosis not present

## 2022-11-14 DIAGNOSIS — E119 Type 2 diabetes mellitus without complications: Secondary | ICD-10-CM | POA: Diagnosis not present

## 2022-11-19 DIAGNOSIS — K080 Exfoliation of teeth due to systemic causes: Secondary | ICD-10-CM | POA: Diagnosis not present

## 2022-12-10 DIAGNOSIS — H6091 Unspecified otitis externa, right ear: Secondary | ICD-10-CM | POA: Diagnosis not present

## 2022-12-10 DIAGNOSIS — Z6823 Body mass index (BMI) 23.0-23.9, adult: Secondary | ICD-10-CM | POA: Diagnosis not present

## 2022-12-13 ENCOUNTER — Encounter: Payer: Self-pay | Admitting: Radiology

## 2022-12-25 DIAGNOSIS — Z6823 Body mass index (BMI) 23.0-23.9, adult: Secondary | ICD-10-CM | POA: Diagnosis not present

## 2022-12-25 DIAGNOSIS — H6091 Unspecified otitis externa, right ear: Secondary | ICD-10-CM | POA: Diagnosis not present

## 2022-12-25 DIAGNOSIS — J329 Chronic sinusitis, unspecified: Secondary | ICD-10-CM | POA: Diagnosis not present

## 2022-12-26 DIAGNOSIS — K080 Exfoliation of teeth due to systemic causes: Secondary | ICD-10-CM | POA: Diagnosis not present

## 2023-01-03 ENCOUNTER — Other Ambulatory Visit: Payer: Medicare Other

## 2023-01-03 DIAGNOSIS — R972 Elevated prostate specific antigen [PSA]: Secondary | ICD-10-CM | POA: Diagnosis not present

## 2023-01-03 DIAGNOSIS — K080 Exfoliation of teeth due to systemic causes: Secondary | ICD-10-CM | POA: Diagnosis not present

## 2023-01-03 DIAGNOSIS — C61 Malignant neoplasm of prostate: Secondary | ICD-10-CM | POA: Diagnosis not present

## 2023-01-04 LAB — PSA: Prostate Specific Ag, Serum: 7.5 ng/mL — ABNORMAL HIGH (ref 0.0–4.0)

## 2023-01-10 ENCOUNTER — Encounter: Payer: Self-pay | Admitting: Urology

## 2023-01-10 ENCOUNTER — Ambulatory Visit: Payer: Medicare Other | Admitting: Urology

## 2023-01-10 VITALS — BP 131/55 | HR 39 | Ht 69.0 in | Wt 148.0 lb

## 2023-01-10 DIAGNOSIS — R972 Elevated prostate specific antigen [PSA]: Secondary | ICD-10-CM | POA: Diagnosis not present

## 2023-01-10 DIAGNOSIS — R3912 Poor urinary stream: Secondary | ICD-10-CM

## 2023-01-10 DIAGNOSIS — N401 Enlarged prostate with lower urinary tract symptoms: Secondary | ICD-10-CM | POA: Diagnosis not present

## 2023-01-10 DIAGNOSIS — C61 Malignant neoplasm of prostate: Secondary | ICD-10-CM | POA: Diagnosis not present

## 2023-01-10 DIAGNOSIS — R351 Nocturia: Secondary | ICD-10-CM

## 2023-01-10 DIAGNOSIS — R3915 Urgency of urination: Secondary | ICD-10-CM

## 2023-01-10 DIAGNOSIS — N138 Other obstructive and reflux uropathy: Secondary | ICD-10-CM

## 2023-01-10 LAB — URINALYSIS, ROUTINE W REFLEX MICROSCOPIC
Bilirubin, UA: NEGATIVE
Glucose, UA: NEGATIVE
Leukocytes,UA: NEGATIVE
Nitrite, UA: NEGATIVE
Protein,UA: NEGATIVE
RBC, UA: NEGATIVE
Specific Gravity, UA: 1.02 (ref 1.005–1.030)
Urobilinogen, Ur: 0.2 mg/dL (ref 0.2–1.0)
pH, UA: 5 (ref 5.0–7.5)

## 2023-01-10 MED ORDER — FINASTERIDE 5 MG PO TABS
5.0000 mg | ORAL_TABLET | Freq: Every day | ORAL | 3 refills | Status: DC
Start: 2023-01-10 — End: 2023-07-11

## 2023-01-10 NOTE — Progress Notes (Signed)
Subjective:  1. Prostate cancer ([name])   2. Elevated PSA   3. BPH with urinary obstruction   4. Weak urinary stream   5. Nocturia   6. Urgency of urination       I have prostate cancer.   01/10/23: Harvest returns today in f/u.  His PSA is down further to 7.5 of finasteride for BPH with BOO and GG1 prostate cancer on surveillance.  His UA is clear.  His IPSS is 11 with nocturia x 2.  He has some intermittency and weights to complete voiding.  He has no associated signs or symptoms.   07/12/22: [name] returns today in f/u for his history of GG1 prostate cancer on surveillance.  He remains on surveillance with a further decline in the PSA to 7.8 prior to this visit.  He is on finasteride for BPH with BOO.  He has been hydrating more with some increased frequency.   His IPSS is 8 with nocturia x 2.  He has no weight loss.  He has no bone pain.   He had an upper endo for dysphagia on 8/2 and that was negative.    04/12/22: [name] returns today in f/u for his history of Gleason 6 prostate cancer on surveillance.  He remains on finasteride for BPH with BOO and his PSA has fallen further to 8.7.  His IPSS is 8.  He is voiding ok with nocutria x 2.  He has no side effects with the med.    12/21/21: Mr. [name] returns today in f/u.  He has been taking the finasteride and his PSA is down to 10.7 from 36.7.   A PMSA PET on 10/05/21 showed only local prostate uptake.  He is voiding with an IPSS of 14.  He has some intermittency.  He has nocturia x 2.  He has a reduced stream.   He is happy with the response to finasteride.  He has no weight loss or bone pain.     09/14/21: Mr. [name] returns today in f/u.  His PSA is up further to 36.7 from 35.9 on 06/01/21 and 34 03/07/21.   He lost his son in law a few months ago and then his daughter more recently from liver cancer.  He has moderate LUTS with an IPSS of 11 and nocturia x 2.   He has some increased urgency with increased water intake.   He was placed on Augmentin for  URI symptoms.  He has no bone pain or weight loss.    06/08/21: Mr [name] returns today in f/u for the history noted below.   He was given finasteride at his last visit but never started it.  His PSA continues to rise slowly and was 35.9 on 06/01/21 from 34 on 03/07/21.  His has stable LUTS and his IPSS is 5 with nocturia x 2.   03/09/21: Mr. [name] returns today for his history of locally progressive prostate cancer.  His PSA continues to rise but relatively slowly and is up to 34 from 32.5 in 10/21.  The PSMA PET in 10/21 only showed local uptake.  He has mild LUTS with nocturia x 2.  His UA is clear. His IPSS is 4.   10/20/20: Mr [name] returns today in f/u to discuss the results of the PSMA PET scan done on 08/04/20.  The study showed intense uptake in the prostate but no metastatic disease.   He is well with and IPSS of 5.  He had some numbness in the  left arm last week when he leans up against a couch arm but it resolves when he lifts it up.  His weight is stable and he has no bone pain. He had bloodwork this week and I don't have the results.     07/22/20:  Mr. [name] returns today in f/u for his history of prostate cancer. He has intermediate risk disease on his biopsy on 03/17/18.  His PSA prior to this visit is up to 32.5 from  23.6 on 11/16/19 with the prior PSA of 22.9 on 08/07/19 which was stable over the last 3 months and in his usual range for the last 24 months.  He had an Axumin PET on 04/15/18 that showed only prostate uptake. His last biopsy was an MRI fusion biopsy on 03/17/18. He had a total 19 biopsied done. There was Gleason 6 in 30% of a single core from the ROI1 in the right TZ out of the 4 biopsies. The 3 biopsies from the left PZ ROI were negative. he had 2 cores on the left with 5% Gleason 6 and a single core at the right apex with Gleason 7(3+4) in 40%. His prostate is 76ml. On the MRI there was several sclerotic lesions in the pelvic bones that are suspicious for mets but that was not  confirmed on the PET. He was biopsied because the PSA had jumped up to 27 but it fell back to 18 prior to the biopsy and has otherwise been stable for the last 2.5-3 years. He is voiding well but has nocturia 2-3x and has some time going back to sleep.  He has some urgency as well.  He has no bone pain or weight loss. He has gained some weight. His IPSS is 4. He has no associated signs or symptoms.      IPSS     Row Name 01/10/23 1100         International Prostate Symptom Score   How often have you had the sensation of not emptying your bladder? About half the time     How often have you had to urinate less than every two hours? Less than 1 in 5 times     How often have you found you stopped and started again several times when you urinated? Less than half the time     How often have you found it difficult to postpone urination? Less than 1 in 5 times     How often have you had a weak urinary stream? Less than half the time     How often have you had to strain to start urination? Not at All     How many times did you typically get up at night to urinate? 2 Times     Total IPSS Score 11       Quality of Life due to urinary symptoms   If you were to spend the rest of your life with your urinary condition just the way it is now how would you feel about that? Mixed                     ROS:  ROS:  A complete review of systems was performed.  All systems are negative except for pertinent findings as noted.   Review of Systems  Endo/Heme/Allergies:  Bruises/bleeds easily.  All other systems reviewed and are negative.   No Known Allergies  Outpatient Encounter Medications as of 01/10/2023  Medication Sig   Cyanocobalamin (B-12 PO)  Take by mouth. 2500 mg daily   Ensure (ENSURE) Take 237 mLs by mouth. One daily   ipratropium (ATROVENT) 0.03 % nasal spray Place 2 sprays into both nostrils 3 (three) times daily.   Multiple Vitamins-Minerals (MULTIVITAMINS THER. W/MINERALS) TABS  Take 1 tablet by mouth daily.   NIFEdipine (PROCARDIA-XL/ADALAT-CC/NIFEDICAL-XL) 30 MG 24 hr tablet Take 30 mg by mouth 2 (two) times daily.   Omega-3 Fatty Acids (FISH OIL) 1200 MG CAPS Take 1,200 mg by mouth daily.   pantoprazole (PROTONIX) 40 MG tablet Take 40 mg by mouth 2 (two) times daily.   zinc gluconate 50 MG tablet Take 50 mg by mouth daily.   zolpidem (AMBIEN) 10 MG tablet Take 10 mg by mouth at bedtime as needed.   [DISCONTINUED] finasteride (PROSCAR) 5 MG tablet Take 1 tablet (5 mg total) by mouth daily.   finasteride (PROSCAR) 5 MG tablet Take 1 tablet (5 mg total) by mouth daily.   No facility-administered encounter medications on file as of 01/10/2023.    Past Medical History:  Diagnosis Date   BPH (benign prostatic hyperplasia)    GI bleed 11/2014   Hypertension     Past Surgical History:  Procedure Laterality Date   APPENDECTOMY     CHOLECYSTECTOMY     COLONOSCOPY N/A 03/26/2013   Procedure: COLONOSCOPY;  Surgeon: Rogene Houston, MD;  [facility];  Service: Endoscopy;  Laterality: N/A;  830   COLONOSCOPY N/A 06/25/2018   Procedure: COLONOSCOPY;  Surgeon: Rogene Houston, MD;  [facility];  Service: Endoscopy;  Laterality: N/A;  200   CYST EXCISION  1960 ?   spine    ESOPHAGOGASTRODUODENOSCOPY (EGD) WITH PROPOFOL N/A 05/16/2022   Procedure: ESOPHAGOGASTRODUODENOSCOPY (EGD) WITH PROPOFOL;  Surgeon: Harvel Quale, MD;  [facility];  Service: Gastroenterology;  Laterality: N/A;  1230 ASA 1   SAVORY DILATION  05/16/2022   Procedure: SAVORY DILATION;  Surgeon: Montez Morita, Quillian Quince, MD;  [facility];  Service: Gastroenterology;;    Social History   Socioeconomic History   Marital status: Married    Spouse name: Not on file   Number of children: Not on file   Years of education: Not on file   Highest education level: Not on file  Occupational History   Not on file  Tobacco Use   Smoking status: Never     Passive exposure: Past   Smokeless tobacco: Never  Vaping Use   Vaping Use: Never used  Substance and Sexual Activity   Alcohol use: No   Drug use: Never   Sexual activity: Not on file  Other Topics Concern   Not on file  Social History Narrative   Not on file   Social Determinants of Health   Financial Resource Strain: Not on file  Food Insecurity: Not on file  Transportation Needs: Not on file  Physical Activity: Not on file  Stress: Not on file  Social Connections: Not on file  Intimate Partner Violence: Not on file    Family History  Problem Relation Age of Onset   Cancer Brother        Objective: BP (!) 131/55   Pulse (!) 39   Ht 5\' 9"  (1.753 m)   Wt 148 lb (67.1 kg)   BMI 21.86 kg/m     Physical Exam Vitals reviewed.  Constitutional:      Appearance: Normal appearance.  Neurological:     Mental Status: He is alert.  Lab Results:  No results found for this or any previous visit (from the past 24 hour(s)).     BMET No results for input(s): "NA", "K", "CL", "CO2", "GLUCOSE", "BUN", "CREATININE", "CALCIUM" in the last 72 hours. PSA Recent Results (from the past 2160 hour(s))  PSA     Status: Abnormal   Collection Time: 01/03/23 10:09 AM  Result Value Ref Range   Prostate Specific Ag, Serum 7.5 (H) 0.0 - 4.0 ng/mL    Comment: Roche ECLIA methodology. According to the American Urological Association, Serum PSA should decrease and remain at undetectable levels after radical prostatectomy. The AUA defines biochemical recurrence as an initial PSA value 0.2 ng/mL or greater followed by a subsequent confirmatory PSA value 0.2 ng/mL or greater. Values obtained with different assay methods or kits cannot be used interchangeably. Results cannot be interpreted as absolute evidence of the presence or absence of malignant disease.   Urinalysis, Routine w reflex microscopic     Status: Abnormal   Collection Time: 01/10/23 10:41 AM  Result Value Ref  Range   Specific Gravity, UA 1.020 1.005 - 1.030   pH, UA 5.0 5.0 - 7.5   Color, UA Yellow Yellow   Appearance Ur Clear Clear   Leukocytes,UA Negative Negative   Protein,UA Negative Negative/Trace   Glucose, UA Negative Negative   Ketones, UA Trace (A) Negative   RBC, UA Negative Negative   Bilirubin, UA Negative Negative   Urobilinogen, Ur 0.2 0.2 - 1.0 mg/dL   Nitrite, UA Negative Negative   Microscopic Examination Comment     Comment: Microscopic not indicated and not performed.     PSA  Date Value Ref Range Status  11/16/2019 23.6 (H) < OR = 4.0 ng/mL Final    Comment:    The total PSA value from this assay system is  standardized against the WHO standard. The test  result will be approximately 20% lower when compared  to the equimolar-standardized total PSA (Beckman  Coulter). Comparison of serial PSA results should be  interpreted with this fact in mind. . This test was performed using the Siemens  chemiluminescent method. Values obtained from  different assay methods cannot be used interchangeably. PSA levels, regardless of value, should not be interpreted as absolute evidence of the presence or absence of disease.    Lab Results  Component Value Date   PSA1 7.5 (H) 01/03/2023   PSA1 7.8 (H) 07/04/2022   PSA1 8.7 (H) 04/05/2022    No results found for this or any previous visit (from the past 24 hour(s)).   UA is clear.     No results found for: "TESTOSTERONE"    Studies/Results: No results found.    Assessment & Plan: Prostate cancer with a rising PSA but only local disease on the PSMA PET.   He has been on the finasteride without side effects and his PSA is down to 7.5.   I will have him return in 6 months with a PSA and exam.   BPH with LUTs.  His symptoms have improved on the finasteride.   Med refilled.     Meds ordered this encounter  Medications   finasteride (PROSCAR) 5 MG tablet    Sig: Take 1 tablet (5 mg total) by mouth daily.     Dispense:  90 tablet    Refill:  3      Orders Placed This Encounter  Procedures   Urinalysis, Routine w reflex microscopic   PSA    Standing Status:  Future    Standing Expiration Date:   01/10/2024      Return in about 6 months (around 07/13/2023) for with PSA.   CC: Sharilyn Sites, MD      Irine Seal 01/11/2023 Patient ID: Wesley Reynolds, male   DOB: 26-Jun-1931, 89 y.o.   MRN: [id number]

## 2023-02-04 ENCOUNTER — Ambulatory Visit (INDEPENDENT_AMBULATORY_CARE_PROVIDER_SITE_OTHER): Payer: Medicare Other | Admitting: Gastroenterology

## 2023-02-14 DIAGNOSIS — Z6822 Body mass index (BMI) 22.0-22.9, adult: Secondary | ICD-10-CM | POA: Diagnosis not present

## 2023-02-14 DIAGNOSIS — M109 Gout, unspecified: Secondary | ICD-10-CM | POA: Diagnosis not present

## 2023-02-27 ENCOUNTER — Encounter: Payer: Self-pay | Admitting: Cardiology

## 2023-02-27 ENCOUNTER — Ambulatory Visit (INDEPENDENT_AMBULATORY_CARE_PROVIDER_SITE_OTHER): Payer: Medicare Other

## 2023-02-27 ENCOUNTER — Ambulatory Visit: Payer: Medicare Other | Attending: Cardiology | Admitting: Cardiology

## 2023-02-27 VITALS — BP 136/72 | HR 88 | Ht 69.0 in | Wt 143.6 lb

## 2023-02-27 DIAGNOSIS — R42 Dizziness and giddiness: Secondary | ICD-10-CM

## 2023-02-27 DIAGNOSIS — I493 Ventricular premature depolarization: Secondary | ICD-10-CM

## 2023-02-27 DIAGNOSIS — I10 Essential (primary) hypertension: Secondary | ICD-10-CM | POA: Diagnosis not present

## 2023-02-27 NOTE — Patient Instructions (Signed)
Medication Instructions:  Your physician recommends that you continue on your current medications as directed. Please refer to the Current Medication list given to you today.  *If you need a refill on your cardiac medications before your next appointment, please call your pharmacy*   Lab Work: None If you have labs (blood work) drawn today and your tests are completely normal, you will receive your results only by: MyChart Message (if you have MyChart) OR A paper copy in the mail If you have any lab test that is abnormal or we need to change your treatment, we will call you to review the results.   Testing/Procedures: 24 Hour Zio   Follow-Up: At [facility], you and your health needs are our priority.  As part of our continuing mission to provide you with exceptional heart care, we have created designated Provider Care Teams.  These Care Teams include your primary Cardiologist (physician) and Advanced Practice Providers (APPs -  Physician Assistants and Nurse Practitioners) who all work together to provide you with the care you need, when you need it.  We recommend signing up for the patient portal called "MyChart".  Sign up information is provided on this After Visit Summary.  MyChart is used to connect with patients for Virtual Visits (Telemedicine).  Patients are able to view lab/test results, encounter notes, upcoming appointments, etc.  Non-urgent messages can be sent to your provider as well.   To learn more about what you can do with MyChart, go to [email or web address].    Your next appointment:    Follow up is pending testing results  Provider:   Dina Rich, MD    Other Instructions Wesley Reynolds- Long Term Monitor Instructions   Your physician has requested you wear your ZIO patch monitor___1____days.   This is a single patch monitor.  [employer] supplies one patch monitor per enrollment.  Additional stickers are not available.   Please do not apply patch if you  will be having a Nuclear Stress Test, Echocardiogram, Cardiac CT, MRI, or Chest Xray during the time frame you would be wearing the monitor. The patch cannot be worn during these tests.  You cannot remove and re-apply the ZIO XT patch monitor.   Your ZIO patch monitor will be sent USPS Priority mail from [facility] directly to your home address. The monitor may also be mailed to a PO BOX if home delivery is not available.   It may take 3-5 days to receive your monitor after you have been enrolled.   Once you have received you monitor, please review enclosed instructions.  Your monitor has already been registered assigning a specific monitor serial # to you.   Applying the monitor   Shave hair from upper left chest.   Hold abrader disc by orange tab.  Rub abrader in 40 strokes over left upper chest as indicated in your monitor instructions.   Clean area with 4 enclosed alcohol pads .  Use all pads to assure are is cleaned thoroughly.  Let dry.   Apply patch as indicated in monitor instructions.  Patch will be place under collarbone on left side of chest with arrow pointing upward.   Rub patch adhesive wings for 2 minutes.Remove white label marked "1".  Remove white label marked "2".  Rub patch adhesive wings for 2 additional minutes.   While looking in a mirror, press and release button in center of patch.  A small green light will flash 3-4 times .  This will be  your only indicator the monitor has been turned on.     Do not shower for the first 24 hours.  You may shower after the first 24 hours.   Press button if you feel a symptom. You will hear a small click.  Record Date, Time and Symptom in the Patient Log Book.   When you are ready to remove patch, follow instructions on last 2 pages of Patient Log Book.  Stick patch monitor onto last page of Patient Log Book.   Place Patient Log Book in [place] box.  Use locking tab on box and tape box closed securely.  The [employer] and [employer]  has [employer] on it.  Please place in mailbox as soon as possible.  Your physician should have your test results approximately 7 days after the monitor has been mailed back to [facility].   Call [facility] Customer Care at [phone] if you have questions regarding your ZIO XT patch monitor.  Call them immediately if you see an orange light blinking on your monitor.   If your monitor falls off in less than 4 days contact our Monitor department at [phone].  If your monitor becomes loose or falls off after 4 days call [employer] at [phone] for suggestions on securing your monitor.

## 2023-02-27 NOTE — Progress Notes (Signed)
Clinical Summary Wesley Reynolds is a 89 y.o.male seen as a new patient, last seen by our practice in 2018.   1. Dizziness with standing - reason for referral based on referral order - on my history infrequent symptoms -occasional lightheadness infrequent, resolves with aggressive hydration -orthostatics negative in clinic today   2. PVCs - noted on EKG today - denies any symptoms.  - no chest pains, no SOB/DOE    Past Medical History:  Diagnosis Date   BPH (benign prostatic hyperplasia)    GI bleed 11/2014   Hypertension      No Known Allergies   Current Outpatient Medications  Medication Sig Dispense Refill   Cyanocobalamin (B-12 PO) Take by mouth. 2500 mg daily     Ensure (ENSURE) Take 237 mLs by mouth. One daily     finasteride (PROSCAR) 5 MG tablet Take 1 tablet (5 mg total) by mouth daily. 90 tablet 3   ipratropium (ATROVENT) 0.03 % nasal spray Place 2 sprays into both nostrils 3 (three) times daily.     Multiple Vitamins-Minerals (MULTIVITAMINS THER. W/MINERALS) TABS Take 1 tablet by mouth daily.     NIFEdipine (PROCARDIA-XL/ADALAT-CC/NIFEDICAL-XL) 30 MG 24 hr tablet Take 30 mg by mouth 2 (two) times daily.  0   Omega-3 Fatty Acids (FISH OIL) 1200 MG CAPS Take 1,200 mg by mouth daily.     pantoprazole (PROTONIX) 40 MG tablet Take 40 mg by mouth 2 (two) times daily.     zinc gluconate 50 MG tablet Take 50 mg by mouth daily.     zolpidem (AMBIEN) 10 MG tablet Take 10 mg by mouth at bedtime as needed.     No current facility-administered medications for this visit.     Past Surgical History:  Procedure Laterality Date   APPENDECTOMY     CHOLECYSTECTOMY     COLONOSCOPY N/A 03/26/2013   Procedure: COLONOSCOPY;  Surgeon: Malissa Hippo, MD;  [facility];  Service: Endoscopy;  Laterality: N/A;  830   COLONOSCOPY N/A 06/25/2018   Procedure: COLONOSCOPY;  Surgeon: Malissa Hippo, MD;  [facility];  Service: Endoscopy;  Laterality: N/A;   200   CYST EXCISION  1960 ?   spine    ESOPHAGOGASTRODUODENOSCOPY (EGD) WITH PROPOFOL N/A 05/16/2022   Procedure: ESOPHAGOGASTRODUODENOSCOPY (EGD) WITH PROPOFOL;  Surgeon: Dolores Frame, MD;  [facility];  Service: Gastroenterology;  Laterality: N/A;  1230 ASA 1   SAVORY DILATION  05/16/2022   Procedure: SAVORY DILATION;  Surgeon: Dolores Frame, MD;  [facility];  Service: Gastroenterology;;     No Known Allergies    Family History  Problem Relation Age of Onset   Cancer Brother      Social History Wesley Reynolds reports that he has never smoked. He has been exposed to tobacco smoke. He has never used smokeless tobacco. Wesley Reynolds reports no history of alcohol use.   Review of Systems CONSTITUTIONAL: No weight loss, fever, chills, weakness or fatigue.  HEENT: Eyes: No visual loss, blurred vision, double vision or yellow sclerae.No hearing loss, sneezing, congestion, runny nose or sore throat.  SKIN: No rash or itching.  CARDIOVASCULAR: per hpi RESPIRATORY: No shortness of breath, cough or sputum.  GASTROINTESTINAL: No anorexia, nausea, vomiting or diarrhea. No abdominal pain or blood.  GENITOURINARY: No burning on urination, no polyuria NEUROLOGICAL: per hpi MUSCULOSKELETAL: No muscle, back pain, joint pain or stiffness.  LYMPHATICS: No enlarged nodes. No history of  splenectomy.  PSYCHIATRIC: No history of depression or anxiety.  ENDOCRINOLOGIC: No reports of sweating, cold or heat intolerance. No polyuria or polydipsia.  Marland Kitchen   Physical Examination Today's Vitals   02/27/23 1046  BP: 136/72  Pulse: 88  SpO2: 98%  Weight: 143 lb 9.6 oz (65.1 kg)  Height: 5\' 9"  (1.753 m)   Body mass index is 21.21 kg/m.  Gen: resting comfortably, no acute distress HEENT: no scleral icterus, pupils equal round and reactive, no palptable cervical adenopathy,  CV: RRR, no mrg, no jvd Resp: Clear to auscultation bilaterally GI: abdomen is soft,  non-tender, non-distended, normal bowel sounds, no hepatosplenomegaly MSK: extremities are warm, no edema.  Skin: warm, no rash Neuro:  no focal deficits Psych: appropriate affect   Diagnostic Studies     Assessment and Plan   1. Orthostatic dizziness - mild infrequent symptoms based on the history he gives me, seems to do well with regular hydration - orthostatics negative today - continue to monitor  2. PVCs - multiple PVCs noted on EKG today - asymptomatic.  - obtain zio patch to assess PVC burden, if high may warrant echo and ischemic testing.   F/u pending monitor results     Antoine Poche, M.D.

## 2023-02-28 DIAGNOSIS — H61001 Unspecified perichondritis of right external ear: Secondary | ICD-10-CM | POA: Diagnosis not present

## 2023-02-28 DIAGNOSIS — D225 Melanocytic nevi of trunk: Secondary | ICD-10-CM | POA: Diagnosis not present

## 2023-02-28 DIAGNOSIS — R208 Other disturbances of skin sensation: Secondary | ICD-10-CM | POA: Diagnosis not present

## 2023-02-28 DIAGNOSIS — L820 Inflamed seborrheic keratosis: Secondary | ICD-10-CM | POA: Diagnosis not present

## 2023-03-07 DIAGNOSIS — I493 Ventricular premature depolarization: Secondary | ICD-10-CM | POA: Diagnosis not present

## 2023-03-19 DIAGNOSIS — I493 Ventricular premature depolarization: Secondary | ICD-10-CM | POA: Diagnosis not present

## 2023-04-04 DIAGNOSIS — L820 Inflamed seborrheic keratosis: Secondary | ICD-10-CM | POA: Diagnosis not present

## 2023-04-25 ENCOUNTER — Telehealth: Payer: Self-pay | Admitting: Cardiology

## 2023-04-25 DIAGNOSIS — I493 Ventricular premature depolarization: Secondary | ICD-10-CM

## 2023-04-25 NOTE — Telephone Encounter (Signed)
Patient notified and verbalized understanding. Patient had no questions or concerns at this time. Patient agreeable with Echo- Pt stated that he will be on vacation next week, so please schedule echo after he returns from vacation.

## 2023-04-25 NOTE — Telephone Encounter (Signed)
Frequent extra heart beats on monitor, can we order an echo for PVCs please to starting looking for causes of the extra heart beats   Dominga Ferry MD

## 2023-04-25 NOTE — Telephone Encounter (Signed)
Patient is returning call to discuss lab results. 

## 2023-05-10 DIAGNOSIS — Z0001 Encounter for general adult medical examination with abnormal findings: Secondary | ICD-10-CM | POA: Diagnosis not present

## 2023-05-10 DIAGNOSIS — Z1331 Encounter for screening for depression: Secondary | ICD-10-CM | POA: Diagnosis not present

## 2023-05-10 DIAGNOSIS — R7303 Prediabetes: Secondary | ICD-10-CM | POA: Diagnosis not present

## 2023-05-10 DIAGNOSIS — J302 Other seasonal allergic rhinitis: Secondary | ICD-10-CM | POA: Diagnosis not present

## 2023-05-10 DIAGNOSIS — Z6822 Body mass index (BMI) 22.0-22.9, adult: Secondary | ICD-10-CM | POA: Diagnosis not present

## 2023-05-10 DIAGNOSIS — I10 Essential (primary) hypertension: Secondary | ICD-10-CM | POA: Diagnosis not present

## 2023-05-10 DIAGNOSIS — E782 Mixed hyperlipidemia: Secondary | ICD-10-CM | POA: Diagnosis not present

## 2023-05-10 DIAGNOSIS — E119 Type 2 diabetes mellitus without complications: Secondary | ICD-10-CM | POA: Diagnosis not present

## 2023-05-10 DIAGNOSIS — I739 Peripheral vascular disease, unspecified: Secondary | ICD-10-CM | POA: Diagnosis not present

## 2023-05-10 DIAGNOSIS — K219 Gastro-esophageal reflux disease without esophagitis: Secondary | ICD-10-CM | POA: Diagnosis not present

## 2023-05-10 DIAGNOSIS — G4700 Insomnia, unspecified: Secondary | ICD-10-CM | POA: Diagnosis not present

## 2023-05-13 DIAGNOSIS — I10 Essential (primary) hypertension: Secondary | ICD-10-CM | POA: Diagnosis not present

## 2023-05-13 DIAGNOSIS — E119 Type 2 diabetes mellitus without complications: Secondary | ICD-10-CM | POA: Diagnosis not present

## 2023-05-13 DIAGNOSIS — G4700 Insomnia, unspecified: Secondary | ICD-10-CM | POA: Diagnosis not present

## 2023-05-31 ENCOUNTER — Ambulatory Visit (HOSPITAL_COMMUNITY)
Admission: RE | Admit: 2023-05-31 | Discharge: 2023-05-31 | Disposition: A | Discharge status: Home / Self Care | Payer: Medicare Other | Source: Ambulatory Visit | Attending: Cardiology | Admitting: Cardiology

## 2023-05-31 DIAGNOSIS — I10 Essential (primary) hypertension: Secondary | ICD-10-CM | POA: Diagnosis not present

## 2023-05-31 DIAGNOSIS — I081 Rheumatic disorders of both mitral and tricuspid valves: Secondary | ICD-10-CM | POA: Insufficient documentation

## 2023-05-31 DIAGNOSIS — I493 Ventricular premature depolarization: Secondary | ICD-10-CM | POA: Diagnosis not present

## 2023-05-31 LAB — ECHOCARDIOGRAM COMPLETE
AR max vel: 1.7 cm2
AV Area VTI: 2 cm2
AV Area mean vel: 1.98 cm2
AV Mean grad: 5.2 mmHg
AV Peak grad: 14.5 mmHg
Ao pk vel: 1.91 m/s
Area-P 1/2: 3.53 cm2
MV M vel: 4.46 m/s
MV Peak grad: 79.6 mmHg
Radius: 0.6 cm
S' Lateral: 2.8 cm

## 2023-05-31 NOTE — Progress Notes (Signed)
*  PRELIMINARY RESULTS* Echocardiogram 2D Echocardiogram has been performed.  Stacey Drain 05/31/2023, 1:43 PM

## 2023-06-05 ENCOUNTER — Telehealth: Payer: Self-pay | Admitting: Cardiology

## 2023-06-05 DIAGNOSIS — I493 Ventricular premature depolarization: Secondary | ICD-10-CM

## 2023-06-05 NOTE — Telephone Encounter (Signed)
Pt is requesting a callback regarding his ECHO results. Please advise

## 2023-06-07 NOTE — Telephone Encounter (Signed)
Patient notified and verbalized understanding. Patient would like to come in and talk to provider regarding care plan.   Please advise.

## 2023-06-07 NOTE — Telephone Encounter (Signed)
-----   Message from Nurse Milas Kocher sent at 06/06/2023  2:57 PM EDT -----  ----- Message ----- From: Antoine Poche, MD Sent: 06/06/2023   2:21 PM EDT To: Lesle Chris, LPN  Normal echo, no evidence of any heart weakness that is triggering extra heart beats. Can we order a lexiscan for PVCs to look for any evidence of blockages in the arteries that could be triggering his extra heart beats  Dominga Ferry MD

## 2023-06-10 NOTE — Telephone Encounter (Signed)
Pt will f/u on 10/8 at 1:20 pm [facility] office

## 2023-06-12 ENCOUNTER — Telehealth (HOSPITAL_COMMUNITY): Payer: Self-pay | Admitting: Emergency Medicine

## 2023-06-13 ENCOUNTER — Encounter (HOSPITAL_COMMUNITY): Payer: Self-pay

## 2023-06-13 ENCOUNTER — Ambulatory Visit (HOSPITAL_COMMUNITY)
Admission: RE | Admit: 2023-06-13 | Discharge: 2023-06-13 | Disposition: A | Discharge status: Home / Self Care | Payer: Medicare Other | Source: Ambulatory Visit | Attending: Cardiology | Admitting: Cardiology

## 2023-06-13 DIAGNOSIS — I493 Ventricular premature depolarization: Secondary | ICD-10-CM | POA: Diagnosis not present

## 2023-06-13 LAB — NM MYOCAR MULTI W/SPECT W/WALL MOTION / EF
LV dias vol: 86 mL (ref 62–150)
LV sys vol: 49 mL
Nuc Stress EF: 44 %
Peak HR: 100 {beats}/min
RATE: 0.3
Rest HR: 90 {beats}/min
Rest Nuclear Isotope Dose: 10.8 mCi
SDS: 1
SRS: 2
SSS: 3
ST Depression (mm): 0 mm
Stress Nuclear Isotope Dose: 32 mCi
TID: 1.1

## 2023-06-13 MED ORDER — TECHNETIUM TC 99M TETROFOSMIN IV KIT
30.0000 | PACK | Freq: Once | INTRAVENOUS | Status: AC | PRN
  Administered 2023-06-13: 32 via INTRAVENOUS

## 2023-06-13 MED ORDER — SODIUM CHLORIDE FLUSH 0.9 % IV SOLN
INTRAVENOUS | Status: AC
  Administered 2023-06-13: 10 mL via INTRAVENOUS
  Filled 2023-06-13: qty 10

## 2023-06-13 MED ORDER — REGADENOSON 0.4 MG/5ML IV SOLN
INTRAVENOUS | Status: AC
  Administered 2023-06-13: 0.4 mg via INTRAVENOUS
  Filled 2023-06-13: qty 5

## 2023-06-13 MED ORDER — TECHNETIUM TC 99M TETROFOSMIN IV KIT
10.0000 | PACK | Freq: Once | INTRAVENOUS | Status: AC | PRN
  Administered 2023-06-13: 10.8 via INTRAVENOUS

## 2023-07-01 DIAGNOSIS — K080 Exfoliation of teeth due to systemic causes: Secondary | ICD-10-CM | POA: Diagnosis not present

## 2023-07-05 ENCOUNTER — Other Ambulatory Visit: Payer: Medicare Other

## 2023-07-05 DIAGNOSIS — C61 Malignant neoplasm of prostate: Secondary | ICD-10-CM

## 2023-07-06 LAB — PSA: Prostate Specific Ag, Serum: 7.2 ng/mL — ABNORMAL HIGH (ref 0.0–4.0)

## 2023-07-11 ENCOUNTER — Ambulatory Visit: Payer: Medicare Other | Admitting: Urology

## 2023-07-11 VITALS — BP 146/78 | HR 72

## 2023-07-11 DIAGNOSIS — R351 Nocturia: Secondary | ICD-10-CM

## 2023-07-11 DIAGNOSIS — C61 Malignant neoplasm of prostate: Secondary | ICD-10-CM | POA: Diagnosis not present

## 2023-07-11 DIAGNOSIS — R3912 Poor urinary stream: Secondary | ICD-10-CM

## 2023-07-11 DIAGNOSIS — N401 Enlarged prostate with lower urinary tract symptoms: Secondary | ICD-10-CM

## 2023-07-11 DIAGNOSIS — R3915 Urgency of urination: Secondary | ICD-10-CM

## 2023-07-11 DIAGNOSIS — N138 Other obstructive and reflux uropathy: Secondary | ICD-10-CM

## 2023-07-11 DIAGNOSIS — R972 Elevated prostate specific antigen [PSA]: Secondary | ICD-10-CM | POA: Diagnosis not present

## 2023-07-11 LAB — MICROSCOPIC EXAMINATION: Bacteria, UA: NONE SEEN

## 2023-07-11 LAB — URINALYSIS, ROUTINE W REFLEX MICROSCOPIC
Bilirubin, UA: NEGATIVE
Glucose, UA: NEGATIVE
Nitrite, UA: NEGATIVE
RBC, UA: NEGATIVE
Specific Gravity, UA: 1.025 (ref 1.005–1.030)
Urobilinogen, Ur: 1 mg/dL (ref 0.2–1.0)
pH, UA: 5.5 (ref 5.0–7.5)

## 2023-07-11 MED ORDER — FINASTERIDE 5 MG PO TABS: 5.0000 mg | ORAL_TABLET | Freq: Every day | ORAL | 3 refills | Status: DC

## 2023-07-18 DIAGNOSIS — Z23 Encounter for immunization: Secondary | ICD-10-CM | POA: Diagnosis not present

## 2023-07-23 ENCOUNTER — Ambulatory Visit: Payer: Medicare Other | Admitting: Cardiology

## 2023-07-23 NOTE — Progress Notes (Deleted)
Clinical Summary Mr. Sarver is a 89 y.o.male  1. Dizziness with standing - reason for referral based on referral order - on my history infrequent symptoms -occasional lightheadness infrequent, resolves with aggressive hydration -orthostatics negative in clinic today     2. PVCs - noted on EKG today - denies any symptoms.  - no chest pains, no SOB/DOE  02/2023 monitor: frequent PVCs, 35.7% burden. 4 runs NSVT longest 6 beats.  -05/2023 echo: LVEF 60-65%, no WMAs, grade I dd -05/2023 nuclear stress: no ischemia       Past Medical History:  Diagnosis Date   BPH (benign prostatic hyperplasia)    GI bleed 11/2014   Hypertension      No Known Allergies   Current Outpatient Medications  Medication Sig Dispense Refill   Cyanocobalamin (B-12 PO) Take by mouth. 2500 mg daily     Ensure (ENSURE) Take 237 mLs by mouth. One daily     finasteride (PROSCAR) 5 MG tablet Take 1 tablet (5 mg total) by mouth daily. 90 tablet 3   ipratropium (ATROVENT) 0.03 % nasal spray Place 2 sprays into both nostrils 3 (three) times daily.     Multiple Vitamins-Minerals (MULTIVITAMINS THER. W/MINERALS) TABS Take 1 tablet by mouth daily.     NIFEdipine (PROCARDIA-XL/ADALAT-CC/NIFEDICAL-XL) 30 MG 24 hr tablet Take 30 mg by mouth 2 (two) times daily.  0   Omega-3 Fatty Acids (FISH OIL) 1200 MG CAPS Take 1,200 mg by mouth daily.     pantoprazole (PROTONIX) 40 MG tablet Take 40 mg by mouth 2 (two) times daily.     zinc gluconate 50 MG tablet Take 50 mg by mouth daily.     zolpidem (AMBIEN) 10 MG tablet Take 10 mg by mouth at bedtime as needed.     No current facility-administered medications for this visit.     Past Surgical History:  Procedure Laterality Date   APPENDECTOMY     CHOLECYSTECTOMY     COLONOSCOPY N/A 03/26/2013   Procedure: COLONOSCOPY;  Surgeon: Malissa Hippo, MD;  [facility];  Service: Endoscopy;  Laterality: N/A;  830   COLONOSCOPY N/A 06/25/2018   Procedure:  COLONOSCOPY;  Surgeon: Malissa Hippo, MD;  [facility];  Service: Endoscopy;  Laterality: N/A;  200   CYST EXCISION  1960 ?   spine    ESOPHAGOGASTRODUODENOSCOPY (EGD) WITH PROPOFOL N/A 05/16/2022   Procedure: ESOPHAGOGASTRODUODENOSCOPY (EGD) WITH PROPOFOL;  Surgeon: Dolores Frame, MD;  [facility];  Service: Gastroenterology;  Laterality: N/A;  1230 ASA 1   SAVORY DILATION  05/16/2022   Procedure: SAVORY DILATION;  Surgeon: Dolores Frame, MD;  [facility];  Service: Gastroenterology;;     No Known Allergies    Family History  Problem Relation Age of Onset   Cancer Brother      Social History Mr. Blaustein reports that he has never smoked. He has been exposed to tobacco smoke. He has never used smokeless tobacco. Mr. Tomei reports no history of alcohol use.   Review of Systems CONSTITUTIONAL: No weight loss, fever, chills, weakness or fatigue.  HEENT: Eyes: No visual loss, blurred vision, double vision or yellow sclerae.No hearing loss, sneezing, congestion, runny nose or sore throat.  SKIN: No rash or itching.  CARDIOVASCULAR:  RESPIRATORY: No shortness of breath, cough or sputum.  GASTROINTESTINAL: No anorexia, nausea, vomiting or diarrhea. No abdominal pain or blood.  GENITOURINARY: No burning on urination, no polyuria NEUROLOGICAL:  No headache, dizziness, syncope, paralysis, ataxia, numbness or tingling in the extremities. No change in bowel or bladder control.  MUSCULOSKELETAL: No muscle, back pain, joint pain or stiffness.  LYMPHATICS: No enlarged nodes. No history of splenectomy.  PSYCHIATRIC: No history of depression or anxiety.  ENDOCRINOLOGIC: No reports of sweating, cold or heat intolerance. No polyuria or polydipsia.  Marland Kitchen   Physical Examination There were no vitals filed for this visit. There were no vitals filed for this visit.  Gen: resting comfortably, no acute distress HEENT: no scleral icterus,  pupils equal round and reactive, no palptable cervical adenopathy,  CV Resp: Clear to auscultation bilaterally GI: abdomen is soft, non-tender, non-distended, normal bowel sounds, no hepatosplenomegaly MSK: extremities are warm, no edema.  Skin: warm, no rash Neuro:  no focal deficits Psych: appropriate affect   Diagnostic Studies  02/2023 heart monitor  24 hr monitor   Rare supraventricular ectopy in the form of isolated PACs, couplets, triplets   Frequent ventricular ectopy in the form of PVCs (35.7% burden), occasional couplets, rare triplets. Four runs of NSVT longest 6 beats   No symptoms reported   05/2023 echo 1. Left ventricular ejection fraction, by estimation, is 60 to 65%. The  left ventricle has normal function. The left ventricle has no regional  wall motion abnormalities. There is mild left ventricular hypertrophy.  Left ventricular diastolic parameters  are consistent with Grade I diastolic dysfunction (impaired relaxation).   2. Right ventricular systolic function is normal. The right ventricular  size is normal.   3. The mitral valve is normal in structure. Mild mitral valve  regurgitation. No evidence of mitral stenosis.   4. The aortic valve is tricuspid. There is mild calcification of the  aortic valve. There is mild thickening of the aortic valve. Aortic valve  regurgitation is not visualized. No aortic stenosis is present.   5. The inferior vena cava is normal in size with greater than 50%  respiratory variability, suggesting right atrial pressure of 3 mmHg.    Assessment and Plan   1. Orthostatic dizziness - mild infrequent symptoms based on the history he gives me, seems to do well with regular hydration - orthostatics negative today - continue to monitor   2. PVCs - multiple PVCs noted on EKG today - asymptomatic.  - obtain zio patch to assess PVC burden, if high may warrant echo and ischemic testing.     Antoine Poche, M.D., F.A.C.C.

## 2023-08-08 ENCOUNTER — Ambulatory Visit (INDEPENDENT_AMBULATORY_CARE_PROVIDER_SITE_OTHER): Payer: Medicare Other | Admitting: Gastroenterology

## 2023-08-08 ENCOUNTER — Encounter (INDEPENDENT_AMBULATORY_CARE_PROVIDER_SITE_OTHER): Payer: Self-pay | Admitting: Gastroenterology

## 2023-08-08 VITALS — BP 123/70 | HR 66 | Temp 97.9°F | Ht 69.0 in | Wt 147.4 lb

## 2023-08-08 DIAGNOSIS — K219 Gastro-esophageal reflux disease without esophagitis: Secondary | ICD-10-CM | POA: Diagnosis not present

## 2023-08-08 DIAGNOSIS — K5900 Constipation, unspecified: Secondary | ICD-10-CM

## 2023-08-08 MED ORDER — PANTOPRAZOLE SODIUM 40 MG PO TBEC: 40.0000 mg | DELAYED_RELEASE_TABLET | Freq: Two times a day (BID) | ORAL | 5 refills | Status: DC

## 2023-08-08 NOTE — Patient Instructions (Signed)
Please continue with protonix 40mg  twice daily Make sure you are taking small bites, chewing thoroughly, taking sips of liquids between bites and staying upright while eating Be mindful of greasy, spicy, fried, citrus foods, caffeine, carbonated drinks, chocolate and alcohol as these can increase reflux symptoms Stay upright 2-3 hours after eating, prior to lying down and avoid eating late in the evenings.  Continue with good water intake as well as plenty of fruits and veggies in your diet and regular exercise as you are doing  You can continue with stool softener for constipation, if you find this is not working, I would recommend 1 capful of miralax every other day.  Follow up 1 year  It was a pleasure to see you today. I want to create trusting relationships with patients and provide genuine, compassionate, and quality care. I truly value your feedback! please be on the lookout for a survey regarding your visit with me today. I appreciate your input about our visit and your time in completing this!    Nylen Creque L. Jeanmarie Hubert, MSN, APRN, AGNP-C Adult-Gerontology Nurse Practitioner [facility] Gastroenterology at [facility]

## 2023-08-08 NOTE — Progress Notes (Addendum)
Referring Provider: Assunta Found, MD Primary Care Physician:  Assunta Found, MD Primary GI Physician: Dr. Levon Hedger   Chief Complaint  Patient presents with   Gastroesophageal Reflux    Follow up on GERD. Taking protonix 40mg  bid. Having some issues at night with swallowing pill. Having to drink more water with it. Has been chewing food good before swallowing so no issues with swallowing food.    Weight Loss    Follow up on weight loss. Taking one ensure daily. States his weight has been stable around 147 lbs.     HPI:   Wesley Reynolds is a 89 y.o. male with past medical history of BPH, GI bleed, HTN, prostate adenocarcinoma (dx 2019).    Patient presenting today for follow up of weight loss, constipation and GERD  Last seen October 2023, at that time swallowing improved since EGD, taking PPI twice daily with good results.  Appetite was good.  Eating 3 meals per day.  Having some intermittent constipation from his finasteride.  Taking something over-the-counter for constipation as needed with good results.  Weight stable at 146 pounds at that time, previously down about 18 pounds after his wife became ill and he had multiple deaths in his family.  Doing Ensure once daily  Recommended continue PPI twice daily, continue Ensure daily, chewing and reflux precautions and continue to monitor for any further weight loss.  Present: Doing well. He is trying to chew food thoroughly and take sips of liquids with each bite, he does well with this as long as he sits up straight when eating. Denies heartburn or acid regurgitation. He denies abdominal pain. Has some gas a times which he takes gas x for with good results. Appetite is good. No diarrhea. He notes that for the past 2-3 weeks he had a sensation of having to strain a bit to get his stools started but then notes stools passed easily thereafter. He will take a stool softener if he goes 2 days without a BM.  He tries to get plenty of fruits in his  diet. Water intake is good, he does not drink sodas. Him and his wife try to work out 3x per week. No rectal bleeding or melena.    Last Colonoscopy: 06/2018- Diverticulosis in the entire examined colon. - Internal hemorrhoids. No specimens Last Endoscopy:05/16/22 - No endoscopic esophageal abnormality to explain patient's dysphagia. Esophagus dilated.  3 cm hiatal hernia - Normal stomach. - Normal examined duodenum. - No specimens collected.   Past Medical History:  Diagnosis Date   BPH (benign prostatic hyperplasia)    GI bleed 11/2014   Hypertension     Past Surgical History:  Procedure Laterality Date   APPENDECTOMY     CHOLECYSTECTOMY     COLONOSCOPY N/A 03/26/2013   Procedure: COLONOSCOPY;  Surgeon: Malissa Hippo, MD;  [facility];  Service: Endoscopy;  Laterality: N/A;  830   COLONOSCOPY N/A 06/25/2018   Procedure: COLONOSCOPY;  Surgeon: Malissa Hippo, MD;  [facility];  Service: Endoscopy;  Laterality: N/A;  200   CYST EXCISION  1960 ?   spine    ESOPHAGOGASTRODUODENOSCOPY (EGD) WITH PROPOFOL N/A 05/16/2022   Procedure: ESOPHAGOGASTRODUODENOSCOPY (EGD) WITH PROPOFOL;  Surgeon: Dolores Frame, MD;  [facility];  Service: Gastroenterology;  Laterality: N/A;  1230 ASA 1   SAVORY DILATION  05/16/2022   Procedure: SAVORY DILATION;  Surgeon: Dolores Frame, MD;  [facility];  Service: Gastroenterology;;  Current Outpatient Medications  Medication Sig Dispense Refill   Cyanocobalamin (B-12 PO) Take by mouth. 2500 mg daily     Ensure (ENSURE) Take 237 mLs by mouth. One daily     finasteride (PROSCAR) 5 MG tablet Take 1 tablet (5 mg total) by mouth daily. 90 tablet 3   ipratropium (ATROVENT) 0.03 % nasal spray Place 2 sprays into both nostrils 3 (three) times daily.     Multiple Vitamins-Minerals (MULTIVITAMINS THER. W/MINERALS) TABS Take 1 tablet by mouth daily.     NIFEdipine  (PROCARDIA-XL/ADALAT-CC/NIFEDICAL-XL) 30 MG 24 hr tablet Take 30 mg by mouth 2 (two) times daily.  0   Omega-3 Fatty Acids (FISH OIL) 1200 MG CAPS Take 1,200 mg by mouth daily.     pantoprazole (PROTONIX) 40 MG tablet Take 40 mg by mouth 2 (two) times daily.     Probiotic Product (ALIGN PO) Take by mouth. Probiotic one daily     zinc gluconate 50 MG tablet Take 50 mg by mouth daily.     zolpidem (AMBIEN) 10 MG tablet Take 10 mg by mouth at bedtime as needed.     No current facility-administered medications for this visit.    Allergies as of 08/08/2023   (No Known Allergies)    Family History  Problem Relation Age of Onset   Cancer Brother     Social History   Socioeconomic History   Marital status: Married    Spouse name: Not on file   Number of children: Not on file   Years of education: Not on file   Highest education level: Not on file  Occupational History   Not on file  Tobacco Use   Smoking status: Never    Passive exposure: Past   Smokeless tobacco: Never  Vaping Use   Vaping status: Never Used  Substance and Sexual Activity   Alcohol use: No   Drug use: Never   Sexual activity: Not on file  Other Topics Concern   Not on file  Social History Narrative   Not on file   Social Determinants of Health   Financial Resource Strain: Not on file  Food Insecurity: Not on file  Transportation Needs: Not on file  Physical Activity: Not on file  Stress: Not on file  Social Connections: Not on file    Review of systems General: negative for malaise, night sweats, fever, chills, weight loss Neck: Negative for lumps, goiter, pain and significant neck swelling Resp: Negative for cough, wheezing, dyspnea at rest CV: Negative for chest pain, leg swelling, palpitations, orthopnea GI: denies melena, hematochezia, nausea, vomiting, diarrhea, constipation, dysphagia, odyonophagia, early satiety or unintentional weight loss.  The remainder of the review of systems is  noncontributory.  Physical Exam: BP 123/70 (BP Location: Left Arm, Patient Position: Sitting, Cuff Size: Normal)   Pulse 66   Temp 97.9 F (36.6 C) (Oral)   Ht 5\' 9"  (1.753 m)   Wt 147 lb 6.4 oz (66.9 kg)   BMI 21.77 kg/m  General:   Alert and oriented. No distress noted. Pleasant and cooperative.  Head:  Normocephalic and atraumatic. Eyes:  Conjuctiva clear without scleral icterus. Mouth:  Oral mucosa pink and moist. Good dentition. No lesions. Heart: Normal rate and rhythm, s1 and s2 heart sounds present.  Lungs: Clear lung sounds in all lobes. Respirations equal and unlabored. Abdomen:  +BS, soft, non-tender and non-distended. No rebound or guarding. No HSM or masses noted. Neurologic:  Alert and  oriented x4 Psych:  Alert and  cooperative. Normal mood and affect.  Invalid input(s): "6 MONTHS"   ASSESSMENT: JARRETH SIDBERRY is a 89 y.o. male presenting today for follow up of weight loss, GERD and constipation   GERD: Well-controlled on Protonix 40 mg twice daily.  Notes he is trying to take small bites, chew thoroughly and taking sips of liquids between bites which has helped with his previous dysphagia, he has no issues with dysphagia as long as he does these things and stays upright while eating.  Recommend he continue his GERD chewing and reflux precautions as well as PPI twice daily.  Constipation thought secondary to finasteride, he is having a bowel movement most every day that will take a stool softener if he goes 2 days without 1.  Water intake is good and he is exercising 3 times a week, try to get plenty of fruits veggies in his diet.  He can continue with stool softener as needed, discussed trying 1 capful of MiraLAX every other day if he is noticing stool softener is not providing results for him.  Some previous weight loss though weight has been stable over the past year.  His appetite is good.    PLAN:  Continue PPI BID  2. Continue with good water intake  3. Continue  diet high in fruits, veggies, whole grains 4. Can continue with stool softener PRN 5. Try 1 capful miralax every other day if stool softener not providing results   All questions were answered, patient verbalized understanding and is in agreement with plan as outlined above.    Follow Up: 1 year   Shron Ozer L. Jeanmarie Hubert, MSN, APRN, AGNP-C Adult-Gerontology Nurse Practitioner [facility]  I have reviewed the note and agree with the APP's assessment as described in this progress note  Katrinka Blazing, MD Gastroenterology and Hepatology [facility] Gastroenterology

## 2023-08-13 ENCOUNTER — Ambulatory Visit: Payer: Medicare Other | Admitting: Cardiology

## 2023-08-19 ENCOUNTER — Ambulatory Visit: Payer: Medicare Other | Admitting: Nurse Practitioner

## 2023-09-02 DIAGNOSIS — H43391 Other vitreous opacities, right eye: Secondary | ICD-10-CM | POA: Diagnosis not present

## 2023-09-19 ENCOUNTER — Encounter: Payer: Self-pay | Admitting: Cardiology

## 2023-09-19 ENCOUNTER — Ambulatory Visit: Payer: Medicare Other | Attending: Nurse Practitioner | Admitting: Cardiology

## 2023-09-19 VITALS — BP 136/65 | HR 67 | Ht 69.0 in | Wt 144.0 lb

## 2023-09-19 DIAGNOSIS — I10 Essential (primary) hypertension: Secondary | ICD-10-CM

## 2023-09-19 DIAGNOSIS — I493 Ventricular premature depolarization: Secondary | ICD-10-CM

## 2023-09-19 NOTE — Patient Instructions (Signed)
Medication Instructions:  Your physician recommends that you continue on your current medications as directed. Please refer to the Current Medication list given to you today.  *If you need a refill on your cardiac medications before your next appointment, please call your pharmacy*   Lab Work: None  If you have labs (blood work) drawn today and your tests are completely normal, you will receive your results only by: MyChart Message (if you have MyChart) OR A paper copy in the mail If you have any lab test that is abnormal or we need to change your treatment, we will call you to review the results.   Testing/Procedures: None   Follow-Up: At [facility], you and your health needs are our priority.  As part of our continuing mission to provide you with exceptional heart care, we have created designated Provider Care Teams.  These Care Teams include your primary Cardiologist (physician) and Advanced Practice Providers (APPs -  Physician Assistants and Nurse Practitioners) who all work together to provide you with the care you need, when you need it.  We recommend signing up for the patient portal called "MyChart".  Sign up information is provided on this After Visit Summary.  MyChart is used to connect with patients for Virtual Visits (Telemedicine).  Patients are able to view lab/test results, encounter notes, upcoming appointments, etc.  Non-urgent messages can be sent to your provider as well.   To learn more about what you can do with MyChart, go to [email or web address].    Your next appointment:   1 year(s)  Provider:   You may see Dina Rich, MD or one of the following Advanced Practice Providers on your designated Care Team:   Randall An, PA-C  Jacolyn Reedy, [place]     Other Instructions

## 2023-09-19 NOTE — Progress Notes (Signed)
Clinical Summary Mr. Wesley Reynolds is a 89 y.o.male seen today for follow up of the following medical problem.s   1. Dizziness with standing -occasional lightheadness infrequent, resolves with aggressive hydration -orthostatics negative in clinic at prior visit  - no recent symptoms.    2. PVCs - noted on EKG today - denies any symptoms.  - no chest pains, no SOB/DOE  02/2023 monitor: 35.7% PVC burden. Four runs of NSVT, longst 6 beats - 05/2023 echo: LVEF 60-65%, no WMAs, grade I dd 05/2023 nuclear stress: no perfusion defects.   - no recent palpitations.   3. HTN - he is on nifedipine. - recent GI visit 123/70 Past Medical History:  Diagnosis Date   BPH (benign prostatic hyperplasia)    GI bleed 11/2014   Hypertension      No Known Allergies   Current Outpatient Medications  Medication Sig Dispense Refill   Cyanocobalamin (B-12 PO) Take by mouth. 2500 mg daily     Ensure (ENSURE) Take 237 mLs by mouth. One daily     finasteride (PROSCAR) 5 MG tablet Take 1 tablet (5 mg total) by mouth daily. 90 tablet 3   ipratropium (ATROVENT) 0.03 % nasal spray Place 2 sprays into both nostrils 3 (three) times daily.     Multiple Vitamins-Minerals (MULTIVITAMINS THER. W/MINERALS) TABS Take 1 tablet by mouth daily.     NIFEdipine (PROCARDIA-XL/ADALAT-CC/NIFEDICAL-XL) 30 MG 24 hr tablet Take 30 mg by mouth 2 (two) times daily.  0   Omega-3 Fatty Acids (FISH OIL) 1200 MG CAPS Take 1,200 mg by mouth daily.     pantoprazole (PROTONIX) 40 MG tablet Take 1 tablet (40 mg total) by mouth 2 (two) times daily. 120 tablet 5   Probiotic Product (ALIGN PO) Take by mouth. Probiotic one daily     zinc gluconate 50 MG tablet Take 50 mg by mouth daily.     zolpidem (AMBIEN) 10 MG tablet Take 10 mg by mouth at bedtime as needed.     No current facility-administered medications for this visit.     Past Surgical History:  Procedure Laterality Date   APPENDECTOMY     CHOLECYSTECTOMY      COLONOSCOPY N/A 03/26/2013   Procedure: COLONOSCOPY;  Surgeon: Malissa Hippo, MD;  [facility];  Service: Endoscopy;  Laterality: N/A;  830   COLONOSCOPY N/A 06/25/2018   Procedure: COLONOSCOPY;  Surgeon: Malissa Hippo, MD;  [facility];  Service: Endoscopy;  Laterality: N/A;  200   CYST EXCISION  1960 ?   spine    ESOPHAGOGASTRODUODENOSCOPY (EGD) WITH PROPOFOL N/A 05/16/2022   Procedure: ESOPHAGOGASTRODUODENOSCOPY (EGD) WITH PROPOFOL;  Surgeon: Dolores Frame, MD;  [facility];  Service: Gastroenterology;  Laterality: N/A;  1230 ASA 1   SAVORY DILATION  05/16/2022   Procedure: SAVORY DILATION;  Surgeon: Dolores Frame, MD;  [facility];  Service: Gastroenterology;;     No Known Allergies    Family History  Problem Relation Age of Onset   Cancer Brother      Social History Mr. Dilullo reports that he has never smoked. He has been exposed to tobacco smoke. He has never used smokeless tobacco. Mr. Shenkman reports no history of alcohol use.   Review of Systems CONSTITUTIONAL: No weight loss, fever, chills, weakness or fatigue.  HEENT: Eyes: No visual loss, blurred vision, double vision or yellow sclerae.No hearing loss, sneezing, congestion, runny nose or sore throat.  SKIN: No  rash or itching.  CARDIOVASCULAR: per hpi RESPIRATORY: No shortness of breath, cough or sputum.  GASTROINTESTINAL: No anorexia, nausea, vomiting or diarrhea. No abdominal pain or blood.  GENITOURINARY: No burning on urination, no polyuria NEUROLOGICAL: per hpi MUSCULOSKELETAL: No muscle, back pain, joint pain or stiffness.  LYMPHATICS: No enlarged nodes. No history of splenectomy.  PSYCHIATRIC: No history of depression or anxiety.  ENDOCRINOLOGIC: No reports of sweating, cold or heat intolerance. No polyuria or polydipsia.  Marland Kitchen   Physical Examination Vitals:   09/19/23 0914 09/19/23 0943  BP: (!) 146/72 136/65  Pulse: 67   SpO2: 97%     Filed Weights   09/19/23 0914  Weight: 144 lb (65.3 kg)    Gen: resting comfortably, no acute distress HEENT: no scleral icterus, pupils equal round and reactive, no palptable cervical adenopathy,  CV: RRR, no m/rg, no jvd Resp: Clear to auscultation bilaterally GI: abdomen is soft, non-tender, non-distended, normal bowel sounds, no hepatosplenomegaly MSK: extremities are warm, no edema.  Skin: warm, no rash Neuro:  no focal deficits Psych: appropriate affect   Diagnostic Studies  02/2023 monitor 24 hr monitor   Rare supraventricular ectopy in the form of isolated PACs, couplets, triplets   Frequent ventricular ectopy in the form of PVCs (35.7% burden), occasional couplets, rare triplets. Four runs of NSVT longest 6 beats   No symptoms reported   Assessment and Plan   1. PVCs - high frequency of PVCs on prior monitor that are asymptomatic - no evidence of structural heart disease by echo or ischemia by nuclear stress testing - in absence of symptoms or structrual hear disease has not required treatment for his PVCs  2. HTN - essentially at ago. Advanced age with some prior orthostatic symptoms would have a conservative bp target for him   F/u 1 year     Antoine Poche, M.D.

## 2023-09-24 DIAGNOSIS — R309 Painful micturition, unspecified: Secondary | ICD-10-CM | POA: Diagnosis not present

## 2023-09-25 DIAGNOSIS — Z6822 Body mass index (BMI) 22.0-22.9, adult: Secondary | ICD-10-CM | POA: Diagnosis not present

## 2023-09-25 DIAGNOSIS — R300 Dysuria: Secondary | ICD-10-CM | POA: Diagnosis not present

## 2023-12-05 DIAGNOSIS — K080 Exfoliation of teeth due to systemic causes: Secondary | ICD-10-CM | POA: Diagnosis not present

## 2023-12-13 DIAGNOSIS — Z6822 Body mass index (BMI) 22.0-22.9, adult: Secondary | ICD-10-CM | POA: Diagnosis not present

## 2023-12-13 DIAGNOSIS — N401 Enlarged prostate with lower urinary tract symptoms: Secondary | ICD-10-CM | POA: Diagnosis not present

## 2023-12-13 DIAGNOSIS — R300 Dysuria: Secondary | ICD-10-CM | POA: Diagnosis not present

## 2023-12-20 DIAGNOSIS — Z20828 Contact with and (suspected) exposure to other viral communicable diseases: Secondary | ICD-10-CM | POA: Diagnosis not present

## 2023-12-20 DIAGNOSIS — J22 Unspecified acute lower respiratory infection: Secondary | ICD-10-CM | POA: Diagnosis not present

## 2023-12-20 DIAGNOSIS — Z6821 Body mass index (BMI) 21.0-21.9, adult: Secondary | ICD-10-CM | POA: Diagnosis not present

## 2023-12-26 DIAGNOSIS — Z6821 Body mass index (BMI) 21.0-21.9, adult: Secondary | ICD-10-CM | POA: Diagnosis not present

## 2023-12-26 DIAGNOSIS — J069 Acute upper respiratory infection, unspecified: Secondary | ICD-10-CM | POA: Diagnosis not present

## 2023-12-26 DIAGNOSIS — Z20828 Contact with and (suspected) exposure to other viral communicable diseases: Secondary | ICD-10-CM | POA: Diagnosis not present

## 2023-12-26 DIAGNOSIS — R6889 Other general symptoms and signs: Secondary | ICD-10-CM | POA: Diagnosis not present

## 2024-01-07 DIAGNOSIS — K080 Exfoliation of teeth due to systemic causes: Secondary | ICD-10-CM | POA: Diagnosis not present

## 2024-01-08 ENCOUNTER — Other Ambulatory Visit: Payer: Medicare Other

## 2024-01-14 DIAGNOSIS — L600 Ingrowing nail: Secondary | ICD-10-CM | POA: Diagnosis not present

## 2024-01-14 DIAGNOSIS — L603 Nail dystrophy: Secondary | ICD-10-CM | POA: Diagnosis not present

## 2024-01-14 DIAGNOSIS — L601 Onycholysis: Secondary | ICD-10-CM | POA: Diagnosis not present

## 2024-01-14 DIAGNOSIS — M79675 Pain in left toe(s): Secondary | ICD-10-CM | POA: Diagnosis not present

## 2024-01-14 DIAGNOSIS — B351 Tinea unguium: Secondary | ICD-10-CM | POA: Diagnosis not present

## 2024-01-16 ENCOUNTER — Ambulatory Visit: Payer: Medicare Other | Admitting: Urology

## 2024-01-20 ENCOUNTER — Ambulatory Visit (INDEPENDENT_AMBULATORY_CARE_PROVIDER_SITE_OTHER): Admitting: Gastroenterology

## 2024-01-28 DIAGNOSIS — Z4889 Encounter for other specified surgical aftercare: Secondary | ICD-10-CM | POA: Diagnosis not present

## 2024-01-31 ENCOUNTER — Other Ambulatory Visit: Payer: Medicare Other

## 2024-01-31 DIAGNOSIS — R972 Elevated prostate specific antigen [PSA]: Secondary | ICD-10-CM | POA: Diagnosis not present

## 2024-02-01 LAB — PSA: Prostate Specific Ag, Serum: 6.5 ng/mL — ABNORMAL HIGH (ref 0.0–4.0)

## 2024-02-06 ENCOUNTER — Ambulatory Visit: Payer: Medicare Other | Admitting: Urology

## 2024-02-06 VITALS — BP 128/51 | HR 86

## 2024-02-06 DIAGNOSIS — N401 Enlarged prostate with lower urinary tract symptoms: Secondary | ICD-10-CM | POA: Diagnosis not present

## 2024-02-06 DIAGNOSIS — C61 Malignant neoplasm of prostate: Secondary | ICD-10-CM | POA: Diagnosis not present

## 2024-02-06 DIAGNOSIS — R3912 Poor urinary stream: Secondary | ICD-10-CM

## 2024-02-06 DIAGNOSIS — N138 Other obstructive and reflux uropathy: Secondary | ICD-10-CM

## 2024-02-06 DIAGNOSIS — R972 Elevated prostate specific antigen [PSA]: Secondary | ICD-10-CM

## 2024-02-06 DIAGNOSIS — R351 Nocturia: Secondary | ICD-10-CM

## 2024-02-06 LAB — URINALYSIS, ROUTINE W REFLEX MICROSCOPIC
Bilirubin, UA: NEGATIVE
Glucose, UA: NEGATIVE
Ketones, UA: NEGATIVE
Leukocytes,UA: NEGATIVE
Nitrite, UA: NEGATIVE
Protein,UA: NEGATIVE
RBC, UA: NEGATIVE
Specific Gravity, UA: 1.025 (ref 1.005–1.030)
Urobilinogen, Ur: 0.2 mg/dL (ref 0.2–1.0)
pH, UA: 5.5 (ref 5.0–7.5)

## 2024-02-06 MED ORDER — FINASTERIDE 5 MG PO TABS: 5.0000 mg | ORAL_TABLET | Freq: Every day | ORAL | 3 refills | Status: DC

## 2024-02-06 NOTE — Progress Notes (Unsigned)
 Subjective:  1. Prostate cancer (HCC)   2. Elevated PSA   3. BPH with urinary obstruction   4. Weak urinary stream   5. Nocturia       I have prostate cancer.  02/06/24: Wesley Reynolds returns today in f/u.  His PSA is down further to 6.5 on finasteride .  He is voiding well with an IPSS of 11 with nocturia 2-3x.  HE has no weight loss or bone pain.   07/11/23: Wesley Reynolds returns today in f/u.  His PSA continues to decline on finasteride  and is down to 7.2.  His IPSS 15 with nocturia x 2.  His UA is clear.   01/10/23: Wesley Reynolds returns today in f/u.  His PSA is down further to 7.5 of finasteride  for BPH with BOO and GG1 prostate cancer on surveillance.  His UA is clear.  His IPSS is 11 with nocturia x 2.  He has some intermittency and weights to complete voiding.  He has no associated signs or symptoms.   07/12/22: Wesley Reynolds returns today in f/u for his history of GG1 prostate cancer on surveillance.  He remains on surveillance with a further decline in the PSA to 7.8 prior to this visit.  He is on finasteride  for BPH with BOO.  He has been hydrating more with some increased frequency.   His IPSS is 8 with nocturia x 2.  He has no weight loss.  He has no bone pain.   He had an upper endo for dysphagia on 8/2 and that was negative.    04/12/22: Wesley Reynolds returns today in f/u for his history of Gleason 6 prostate cancer on surveillance.  He remains on finasteride  for BPH with BOO and his PSA has fallen further to 8.7.  His IPSS is 8.  He is voiding ok with nocutria x 2.  He has no side effects with the med.    12/21/21: Wesley Reynolds returns today in f/u.  He has been taking the finasteride  and his PSA is down to 10.7 from 36.7.   A PMSA PET on 10/05/21 showed only local prostate uptake.  He is voiding with an IPSS of 14.  He has some intermittency.  He has nocturia x 2.  He has a reduced stream.   He is happy with the response to finasteride .  He has no weight loss or bone pain.     09/14/21: Wesley Reynolds returns today in f/u.  His PSA  is up further to 36.7 from 35.9 on 06/01/21 and 34 03/07/21.   He lost his son in law a few months ago and then his daughter more recently from liver cancer.  He has moderate LUTS with an IPSS of 11 and nocturia x 2.   He has some increased urgency with increased water  intake.   He was placed on Augmentin for URI symptoms.  He has no bone pain or weight loss.    06/08/21: Wesley Reynolds returns today in f/u for the history noted below.   He was given finasteride  at his last visit but never started it.  His PSA continues to rise slowly and was 35.9 on 06/01/21 from 34 on 03/07/21.  His has stable LUTS and his IPSS is 5 with nocturia x 2.   03/09/21: Wesley Reynolds returns today for his history of locally progressive prostate cancer.  His PSA continues to rise but relatively slowly and is up to 34 from 32.5 in 10/21.  The PSMA PET in 10/21 only showed local uptake.  He  has mild LUTS with nocturia x 2.  His UA is clear. His IPSS is 4.   10/20/20: Wesley Reynolds returns today in f/u to discuss the results of the PSMA PET scan done on 08/04/20.  The study showed intense uptake in the prostate but no metastatic disease.   He is well with and IPSS of 5.  He had some numbness in the left arm last week when he leans up against a couch arm but it resolves when he lifts it up.  His weight is stable and he has no bone pain. He had bloodwork this week and I don't have the results.     07/22/20:  Wesley Reynolds returns today in f/u for his history of prostate cancer. He has intermediate risk disease on his biopsy on 03/17/18.  His PSA prior to this visit is up to 32.5 from  23.6 on 11/16/19 with the prior PSA of 22.9 on 08/07/19 which was stable over the last 3 months and in his usual range for the last 24 months.  He had an Axumin  PET on 04/15/18 that showed only prostate uptake. His last biopsy was an MRI fusion biopsy on 03/17/18. He had a total 19 biopsied done. There was Gleason 6 in 30% of a single core from the ROI1 in the right TZ out of the 4  biopsies. The 3 biopsies from the left PZ ROI were negative. he had 2 cores on the left with 5% Gleason 6 and a single core at the right apex with Gleason 7(3+4) in 40%. His prostate is 94ml. On the MRI there was several sclerotic lesions in the pelvic bones that are suspicious for mets but that was not confirmed on the PET. He was biopsied because the PSA had jumped up to 27 but it fell back to 18 prior to the biopsy and has otherwise been stable for the last 2.5-3 years. He is voiding well but has nocturia 2-3x and has some time going back to sleep.  He has some urgency as well.  He has no bone pain or weight loss. He has gained some weight. His IPSS is 4. He has no associated signs or symptoms.      IPSS     Row Name 02/06/24 1300         International Prostate Symptom Score   How often have you had the sensation of not emptying your bladder? Less than 1 in 5     How often have you had to urinate less than every two hours? Less than half the time     How often have you found you stopped and started again several times when you urinated? Less than half the time     How often have you found it difficult to postpone urination? Less than 1 in 5 times     How often have you had a weak urinary stream? Less than half the time     How often have you had to strain to start urination? Less than 1 in 5 times     How many times did you typically get up at night to urinate? 2 Times     Total IPSS Score 11       Quality of Life due to urinary symptoms   If you were to spend the rest of your life with your urinary condition just the way it is now how would you feel about that? Mostly Satisfied  ROS:  ROS:  A complete review of systems was performed.  All systems are negative except for pertinent findings as noted.   Review of Systems  Endo/Heme/Allergies:  Bruises/bleeds easily.  All other systems reviewed and are negative.   No Known Allergies  Outpatient  Encounter Medications as of 02/06/2024  Medication Sig   Cyanocobalamin (B-12 PO) Take by mouth. 2500 mg daily   Ensure (ENSURE) Take 237 mLs by mouth. One daily   finasteride  (PROSCAR ) 5 MG tablet Take 1 tablet (5 mg total) by mouth daily.   ipratropium (ATROVENT) 0.03 % nasal spray Place 2 sprays into both nostrils 3 (three) times daily.   Multiple Vitamins-Minerals (MULTIVITAMINS THER. W/MINERALS) TABS Take 1 tablet by mouth daily.   NIFEdipine  (PROCARDIA -XL/ADALAT -CC/NIFEDICAL-XL) 30 MG 24 hr tablet Take 30 mg by mouth 2 (two) times daily.   Omega-3 Fatty Acids (FISH OIL) 1200 MG CAPS Take 1,200 mg by mouth daily.   pantoprazole  (PROTONIX ) 40 MG tablet Take 1 tablet (40 mg total) by mouth 2 (two) times daily.   Probiotic Product (ALIGN PO) Take by mouth. Probiotic one daily   zinc gluconate 50 MG tablet Take 50 mg by mouth daily.   zolpidem  (AMBIEN ) 10 MG tablet Take 10 mg by mouth at bedtime as needed.   [DISCONTINUED] finasteride  (PROSCAR ) 5 MG tablet Take 1 tablet (5 mg total) by mouth daily.   No facility-administered encounter medications on file as of 02/06/2024.    Past Medical History:  Diagnosis Date   BPH (benign prostatic hyperplasia)    GI bleed 11/2014   Hypertension     Past Surgical History:  Procedure Laterality Date   APPENDECTOMY     CHOLECYSTECTOMY     COLONOSCOPY N/A 03/26/2013   Procedure: COLONOSCOPY;  Surgeon: Ruby Corporal, MD;  [facility];  Service: Endoscopy;  Laterality: N/A;  830   COLONOSCOPY N/A 06/25/2018   Procedure: COLONOSCOPY;  Surgeon: Ruby Corporal, MD;  [facility];  Service: Endoscopy;  Laterality: N/A;  200   CYST EXCISION  1960 ?   spine    ESOPHAGOGASTRODUODENOSCOPY (EGD) WITH PROPOFOL  N/A 05/16/2022   Procedure: ESOPHAGOGASTRODUODENOSCOPY (EGD) WITH PROPOFOL ;  Surgeon: Urban Garden, MD;  [facility];  Service: Gastroenterology;  Laterality: N/A;  1230 ASA 1   SAVORY DILATION  05/16/2022    Procedure: SAVORY DILATION;  Surgeon: Wesley Reynolds, Wesley Limes, MD;  [facility];  Service: Gastroenterology;;    Social History   Socioeconomic History   Marital status: Married    Spouse name: Not on file   Number of children: Not on file   Years of education: Not on file   Highest education level: Not on file  Occupational History   Not on file  Tobacco Use   Smoking status: Never    Passive exposure: Past   Smokeless tobacco: Never  Vaping Use   Vaping status: Never Used  Substance and Sexual Activity   Alcohol use: No   Drug use: Never   Sexual activity: Not on file  Other Topics Concern   Not on file  Social History Narrative   Not on file   Social Drivers of Health   Financial Resource Strain: Not on file  Food Insecurity: Not on file  Transportation Needs: Not on file  Physical Activity: Not on file  Stress: Not on file  Social Connections: Not on file  Intimate Partner Violence: Not on file    Family History  Problem Relation Age of Onset   Cancer Brother        Objective: BP (!) 128/51     Physical Exam Vitals reviewed.  Constitutional:      Appearance: Normal appearance.  Genitourinary:    Comments: Prostate is 1.5+ and smooth without nodules. SV's non-palpable.  Neurological:     Mental Status: He is alert.     Lab Results:  No results found for this or any previous visit (from the past 24 hours). UA is clear.     BMET No results for input(s): "NA", "K", "CL", "CO2", "GLUCOSE", "BUN", "CREATININE", "CALCIUM" in the last 72 hours. PSA Recent Results (from the past 2160 hours)  PSA     Status: Abnormal   Collection Time: 01/31/24 10:24 AM  Result Value Ref Range   Prostate Specific Ag, Serum 6.5 (H) 0.0 - 4.0 ng/mL    Comment: Roche ECLIA methodology. According to the American Urological Association, Serum PSA should decrease and remain at undetectable levels after radical prostatectomy. The AUA defines  biochemical recurrence as an initial PSA value 0.2 ng/mL or greater followed by a subsequent confirmatory PSA value 0.2 ng/mL or greater. Values obtained with different assay methods or kits cannot be used interchangeably. Results cannot be interpreted as absolute evidence of the presence or absence of malignant disease.      PSA  Date Value Ref Range Status  11/16/2019 23.6 (H) < OR = 4.0 ng/mL Final    Comment:    The total PSA value from this assay system is  standardized against the WHO standard. The test  result will be approximately 20% lower when compared  to the equimolar-standardized total PSA (Beckman  Wesley Reynolds). Comparison of serial PSA results should be  interpreted with this fact in mind. . This test was performed using the Siemens  chemiluminescent method. Values obtained from  different assay methods cannot be used interchangeably. PSA levels, regardless of value, should not be interpreted as absolute evidence of the presence or absence of disease.    Lab Results  Component Value Date   PSA1 6.5 (H) 01/31/2024   PSA1 7.2 (H) 07/05/2023   PSA1 7.5 (H) 01/03/2023    No results found for this or any previous visit (from the past 24 hours).   UA is clear.     No results found for: "TESTOSTERONE"    Studies/Results: No results found.    Assessment & Plan: Prostate cancer with a rising PSA but only local disease on the PSMA PET.   He has been on the finasteride  without side effects and his PSA is down to 6.5.   I will have him return in 6 months with a PSA and exam.   BPH with LUTs.  His symptoms are stable on the finasteride .   Med refilled.     Meds ordered this encounter  Medications   finasteride  (PROSCAR ) 5 MG tablet    Sig: Take 1 tablet (5 mg total) by mouth daily.    Dispense:  90 tablet    Refill:  3      Orders Placed This Encounter  Procedures   Urinalysis, Routine w reflex microscopic   PSA    Standing Status:   Future     Expected Date:   08/07/2024    Expiration Date:   02/05/2025      Return for any available provider with PSA. Aaron Aas   CC: Minus Amel, MD      Homero Luster 02/06/2024 Patient ID:  Wesley Reynolds, male   DOB: Dec 05, 1930, 89 y.o.   MRN: [id number]

## 2024-02-07 ENCOUNTER — Encounter: Payer: Self-pay | Admitting: Urology

## 2024-02-20 ENCOUNTER — Ambulatory Visit (INDEPENDENT_AMBULATORY_CARE_PROVIDER_SITE_OTHER): Admitting: Gastroenterology

## 2024-02-20 ENCOUNTER — Encounter (INDEPENDENT_AMBULATORY_CARE_PROVIDER_SITE_OTHER): Payer: Self-pay | Admitting: Gastroenterology

## 2024-02-20 VITALS — BP 154/55 | HR 40 | Temp 98.0°F | Ht 69.0 in | Wt 144.7 lb

## 2024-02-20 DIAGNOSIS — K5900 Constipation, unspecified: Secondary | ICD-10-CM

## 2024-02-20 DIAGNOSIS — R1319 Other dysphagia: Secondary | ICD-10-CM

## 2024-02-20 DIAGNOSIS — R1310 Dysphagia, unspecified: Secondary | ICD-10-CM | POA: Diagnosis not present

## 2024-02-20 DIAGNOSIS — K219 Gastro-esophageal reflux disease without esophagitis: Secondary | ICD-10-CM

## 2024-02-20 NOTE — Progress Notes (Addendum)
 Referring Provider: Minus Amel, MD Primary Care Physician:  Minus Amel, MD Primary GI Physician: Dr. Sammi Crick   Chief Complaint  Patient presents with   Dysphagia    Follow up on dysphagia. Has some trouble swallowing pills at night.    HPI:   Wesley Reynolds is a 89 y.o. male with past medical history of  BPH, GI bleed, HTN, prostate adenocarcinoma (dx 2019).    Patient presenting today for:  Follow up of constipation, GERD, dysphagia   Last seen October 2024, doing well at that time. Chewing well. No heartburn or acid regurgitation. Taking gas x with good results. Having some need to strain recently. Taking stool softeners  Recommended PPI BID, good water  intake, high fiber diet, continue stool softener PRN, 1 capful miralax  every other day  Present: Weight stable at 144. Appetite is good. He is very active and works out regularly at home with his wife.   Still having some issues with dysphagia, has to hold his head back to swallow a larger pill with a lot of water  or else he will choke. No real issues with foods going down. Tends to just happen with larger pills. He also notes that pills he has to chew, the powder will stick in his throat unless he takes sips of water  while he's doing this. On rare occasion will feel that water  does not want to go down and he will have to swallow a second time which will prompt it to go down. No issues with heartburn. Doing well on protonix  40mg  BID  Bowels are moving well. He keeps close track of this and does not miss more than 2 days without going. He is taking a probiotic every other day. Does not think he is taking anything else for constipation. Denies any current issues. No abdominal pain, rectal bleeding or melena.    Last colonoscopy 06/2018- Diverticulosis in the entire examined colon. - Internal hemorrhoids. No specimens Last Endoscopy:05/16/22 - No endoscopic esophageal abnormality to explain patient's dysphagia. Esophagus  dilated.  3 cm hiatal hernia - Normal stomach. - Normal examined duodenum. - No specimens collected.  Filed Weights   02/20/24 0848  Weight: 144 lb 11.2 oz (65.6 kg)     Past Medical History:  Diagnosis Date   BPH (benign prostatic hyperplasia)    GI bleed 11/2014   Hypertension     Past Surgical History:  Procedure Laterality Date   APPENDECTOMY     CHOLECYSTECTOMY     COLONOSCOPY N/A 03/26/2013   Procedure: COLONOSCOPY;  Surgeon: Ruby Corporal, MD;  [facility];  Service: Endoscopy;  Laterality: N/A;  830   COLONOSCOPY N/A 06/25/2018   Procedure: COLONOSCOPY;  Surgeon: Ruby Corporal, MD;  [facility];  Service: Endoscopy;  Laterality: N/A;  200   CYST EXCISION  1960 ?   spine    ESOPHAGOGASTRODUODENOSCOPY (EGD) WITH PROPOFOL  N/A 05/16/2022   Procedure: ESOPHAGOGASTRODUODENOSCOPY (EGD) WITH PROPOFOL ;  Surgeon: Urban Garden, MD;  [facility];  Service: Gastroenterology;  Laterality: N/A;  1230 ASA 1   SAVORY DILATION  05/16/2022   Procedure: SAVORY DILATION;  Surgeon: Umberto Ganong, Bearl Limes, MD;  [facility];  Service: Gastroenterology;;    Current Outpatient Medications  Medication Sig Dispense Refill   acetaminophen (TYLENOL) 500 MG tablet Take 500 mg by mouth every 6 (six) hours as needed.     Cyanocobalamin (B-12 PO) Take by mouth. 2500 mg daily  Ensure (ENSURE) Take 237 mLs by mouth. One daily     finasteride  (PROSCAR ) 5 MG tablet Take 1 tablet (5 mg total) by mouth daily. 90 tablet 3   Ibuprofen (ADVIL PO) Take by mouth.     ipratropium (ATROVENT) 0.03 % nasal spray Place 2 sprays into both nostrils 3 (three) times daily.     Multiple Vitamins-Minerals (MULTIVITAMINS THER. W/MINERALS) TABS Take 1 tablet by mouth daily.     NIFEdipine  (PROCARDIA -XL/ADALAT -CC/NIFEDICAL-XL) 30 MG 24 hr tablet Take 30 mg by mouth 2 (two) times daily.  0   Omega-3 Fatty Acids (FISH OIL) 1200 MG CAPS Take 1,200 mg by mouth  daily.     pantoprazole  (PROTONIX ) 40 MG tablet Take 1 tablet (40 mg total) by mouth 2 (two) times daily. 120 tablet 5   Probiotic Product (ALIGN PO) Take by mouth. Probiotic one every other day     zinc gluconate 50 MG tablet Take 50 mg by mouth daily.     zolpidem  (AMBIEN ) 10 MG tablet Take 10 mg by mouth at bedtime as needed.     No current facility-administered medications for this visit.    Allergies as of 02/20/2024   (No Known Allergies)    Social History   Socioeconomic History   Marital status: Married    Spouse name: Not on file   Number of children: Not on file   Years of education: Not on file   Highest education level: Not on file  Occupational History   Not on file  Tobacco Use   Smoking status: Never    Passive exposure: Past   Smokeless tobacco: Never  Vaping Use   Vaping status: Never Used  Substance and Sexual Activity   Alcohol use: No   Drug use: Never   Sexual activity: Not on file  Other Topics Concern   Not on file  Social History Narrative   Not on file   Social Drivers of Health   Financial Resource Strain: Not on file  Food Insecurity: Not on file  Transportation Needs: Not on file  Physical Activity: Not on file  Stress: Not on file  Social Connections: Not on file    Review of systems General: negative for malaise, night sweats, fever, chills, weight loss Neck: Negative for lumps, goiter, pain and significant neck swelling Resp: Negative for cough, wheezing, dyspnea at rest CV: Negative for chest pain, leg swelling, palpitations, orthopnea GI: denies melena, hematochezia, nausea, vomiting, diarrhea, constipation, dysphagia, odyonophagia, early satiety or unintentional weight loss. +dysphagia with pills  The remainder of the review of systems is noncontributory.  Physical Exam: BP (!) 154/55   Pulse (!) 40   Temp 98 F (36.7 C) (Oral)   Ht 5\' 9"  (1.753 m)   Wt 144 lb 11.2 oz (65.6 kg)   BMI 21.37 kg/m  General:   Alert and  oriented. No distress noted. Pleasant and cooperative.  Head:  Normocephalic and atraumatic. Eyes:  Conjuctiva clear without scleral icterus. Mouth:  Oral mucosa pink and moist. Good dentition. No lesions. Heart: Normal rate and rhythm, s1 and s2 heart sounds present.  Lungs: Clear lung sounds in all lobes. Respirations equal and unlabored. Abdomen:  +BS, soft, non-tender and non-distended. No rebound or guarding. No HSM or masses noted. Derm: No palmar erythema or jaundice Neurologic:  Alert and  oriented x4 Psych:  Alert and cooperative. Normal mood and affect.  Invalid input(s): "6 MONTHS"   ASSESSMENT: Wesley Reynolds is a 89 y.o. male presenting  today for follow up of GERD, dysphagia and constipation  Constipation: well managed with probiotic. Keeping track of BMs. Does not go more than 2 days without one. No real issues at this time. Can continue with probiotic, good water  intake, high fiber diet  GERD with dysphagia: well controlled on protonix  40mg  BID. No breakthrough symptoms. Having some dysphagia only with larger pills or powdery pills he has to chew up. No issues with foods. Rarely water  will feel is pauses and he will have to re swallow. Suspect more dysmotility as last EGD without findings for cause of his dysphagia. Does note empiric dilation helped at that time. Would be more inclined to do Barium esophagram with SLP evaluation, at this time he is ok to hold off on this, he will let me know if symptoms worsen. For now recommend taking larger pills with applesauce or yogurt, can crush chewables and take this way as well as these tend to be his only issue when swallowing.   PLAN:  -continue protonix  40mg  BID -continue probiotic -Increase water  intake, aim for atleast 64 oz per day -Increase fruits, veggies and whole grains, kiwi and prunes are especially good for constipation -consider BPE with SLP evaluation if dysphagia worsens  -try taking pills with applesauce,  yogurt  All questions were answered, patient verbalized understanding and is in agreement with plan as outlined above.   Follow Up: 3 months   Shanette Tamargo L. Adrien Alberta, MSN, APRN, AGNP-C Adult-Gerontology Nurse Practitioner [facility]  I have reviewed the note and agree with the APP's assessment as described in this progress note  Samantha Cress, MD Gastroenterology and Hepatology [facility]

## 2024-02-20 NOTE — Patient Instructions (Signed)
-  continue protonix  40mg  twice daily  -continue probiotic -Increase water  intake, aim for atleast 64 oz per day -Increase fruits, veggies and whole grains, kiwi and prunes are especially good for constipation -try taking larger pills with applesauce, yogurt or can crush chewable pills and take this way, make sure you are drinking sips of water  with pills and can try eating a small bite of food after as well -as discussed, if this is worsening or starts to happen with foods as well, please let me know as we can do a swallow evaluation to see if this is a movement issue in the esophagus  Follow up 3 months  It was a pleasure to see you today. I want to create trusting relationships with patients and provide genuine, compassionate, and quality care. I truly value your feedback! please be on the lookout for a survey regarding your visit with me today. I appreciate your input about our visit and your time in completing this!    Rolando Whitby L. Correll Denbow, MSN, APRN, AGNP-C Adult-Gerontology Nurse Practitioner [facility] at [facility]

## 2024-03-18 DIAGNOSIS — G4700 Insomnia, unspecified: Secondary | ICD-10-CM | POA: Diagnosis not present

## 2024-03-18 DIAGNOSIS — Z6821 Body mass index (BMI) 21.0-21.9, adult: Secondary | ICD-10-CM | POA: Diagnosis not present

## 2024-05-11 DIAGNOSIS — I10 Essential (primary) hypertension: Secondary | ICD-10-CM | POA: Diagnosis not present

## 2024-05-11 DIAGNOSIS — E782 Mixed hyperlipidemia: Secondary | ICD-10-CM | POA: Diagnosis not present

## 2024-05-11 DIAGNOSIS — Z6821 Body mass index (BMI) 21.0-21.9, adult: Secondary | ICD-10-CM | POA: Diagnosis not present

## 2024-05-11 DIAGNOSIS — Z1331 Encounter for screening for depression: Secondary | ICD-10-CM | POA: Diagnosis not present

## 2024-05-11 DIAGNOSIS — C61 Malignant neoplasm of prostate: Secondary | ICD-10-CM | POA: Diagnosis not present

## 2024-05-11 DIAGNOSIS — Z0001 Encounter for general adult medical examination with abnormal findings: Secondary | ICD-10-CM | POA: Diagnosis not present

## 2024-05-11 DIAGNOSIS — R7303 Prediabetes: Secondary | ICD-10-CM | POA: Diagnosis not present

## 2024-05-13 DIAGNOSIS — E119 Type 2 diabetes mellitus without complications: Secondary | ICD-10-CM | POA: Diagnosis not present

## 2024-05-13 DIAGNOSIS — E7849 Other hyperlipidemia: Secondary | ICD-10-CM | POA: Diagnosis not present

## 2024-05-13 DIAGNOSIS — I10 Essential (primary) hypertension: Secondary | ICD-10-CM | POA: Diagnosis not present

## 2024-05-26 ENCOUNTER — Ambulatory Visit (INDEPENDENT_AMBULATORY_CARE_PROVIDER_SITE_OTHER): Admitting: Gastroenterology

## 2024-07-14 DIAGNOSIS — K080 Exfoliation of teeth due to systemic causes: Secondary | ICD-10-CM | POA: Diagnosis not present

## 2024-07-29 ENCOUNTER — Encounter (INDEPENDENT_AMBULATORY_CARE_PROVIDER_SITE_OTHER): Payer: Self-pay | Admitting: Gastroenterology

## 2024-08-07 ENCOUNTER — Other Ambulatory Visit

## 2024-08-07 DIAGNOSIS — C61 Malignant neoplasm of prostate: Secondary | ICD-10-CM

## 2024-08-08 LAB — PSA: Prostate Specific Ag, Serum: 6.2 ng/mL — ABNORMAL HIGH (ref 0.0–4.0)

## 2024-08-10 ENCOUNTER — Ambulatory Visit (INDEPENDENT_AMBULATORY_CARE_PROVIDER_SITE_OTHER): Payer: Medicare Other | Admitting: Gastroenterology

## 2024-08-10 ENCOUNTER — Ambulatory Visit: Payer: Self-pay

## 2024-08-12 ENCOUNTER — Ambulatory Visit: Admitting: Urology

## 2024-08-12 ENCOUNTER — Encounter: Payer: Self-pay | Admitting: Urology

## 2024-08-12 VITALS — BP 141/62 | HR 58

## 2024-08-12 DIAGNOSIS — R351 Nocturia: Secondary | ICD-10-CM | POA: Diagnosis not present

## 2024-08-12 DIAGNOSIS — N401 Enlarged prostate with lower urinary tract symptoms: Secondary | ICD-10-CM

## 2024-08-12 DIAGNOSIS — C61 Malignant neoplasm of prostate: Secondary | ICD-10-CM

## 2024-08-12 DIAGNOSIS — R972 Elevated prostate specific antigen [PSA]: Secondary | ICD-10-CM

## 2024-08-12 LAB — URINALYSIS, ROUTINE W REFLEX MICROSCOPIC
Bilirubin, UA: NEGATIVE
Glucose, UA: NEGATIVE
Leukocytes,UA: NEGATIVE
Nitrite, UA: NEGATIVE
Protein,UA: NEGATIVE
RBC, UA: NEGATIVE
Specific Gravity, UA: 1.02 (ref 1.005–1.030)
Urobilinogen, Ur: 0.2 mg/dL (ref 0.2–1.0)
pH, UA: 6 (ref 5.0–7.5)

## 2024-08-12 MED ORDER — FINASTERIDE 5 MG PO TABS: 5.0000 mg | ORAL_TABLET | Freq: Every day | ORAL | 3 refills | Status: AC

## 2024-08-12 NOTE — Progress Notes (Signed)
 08/12/2024 11:50 AM   Wesley Reynolds 06-Feb-1931 [id number]  Referring provider: Marvine Rush, MD [address] [place],  [place] [address]  Followup prostate cancer   HPI: Mr Penton is a 89yo here for followup for prostate cancer and BPH with Nocturia. PSA decreased to 6.2 from 6.5 on finasteride . IPSS 12 QOL 2 on finasteride . No straining to urinate. Urine stream is mostly strong. He starting/stopping of his urinary stream. No post void dribbling.    PMH: Past Medical History:  Diagnosis Date   BPH (benign prostatic hyperplasia)    GI bleed 11/2014   Hypertension     Surgical History: Past Surgical History:  Procedure Laterality Date   APPENDECTOMY     CHOLECYSTECTOMY     COLONOSCOPY N/A 03/26/2013   Procedure: COLONOSCOPY;  Surgeon: Claudis RAYMOND Rivet, MD;  [facility]: [facility];  Service: Endoscopy;  Laterality: N/A;  830   COLONOSCOPY N/A 06/25/2018   Procedure: COLONOSCOPY;  Surgeon: Rivet Claudis RAYMOND, MD;  Location: [facility];  Service: Endoscopy;  Laterality: N/A;  200   CYST EXCISION  1960 ?   spine    ESOPHAGOGASTRODUODENOSCOPY (EGD) WITH PROPOFOL  N/A 05/16/2022   Procedure: ESOPHAGOGASTRODUODENOSCOPY (EGD) WITH PROPOFOL ;  Surgeon: Eartha Angelia Sieving, MD;  Location: [facility];  Service: Gastroenterology;  Laterality: N/A;  1230 ASA 1   SAVORY DILATION  05/16/2022   Procedure: SAVORY DILATION;  Surgeon: Eartha Angelia Sieving, MD;  Location: [facility];  Service: Gastroenterology;;    Home Medications:  Allergies as of 08/12/2024   No Known Allergies      Medication List        Accurate as of August 12, 2024 11:50 AM. If you have any questions, ask your nurse or doctor.          acetaminophen 500 MG tablet Commonly known as: TYLENOL Take 500 mg by mouth every 6 (six) hours as needed.   ADVIL PO Take by mouth.   ALIGN PO Take by mouth. Probiotic one every other day   B-12 PO Take by mouth. 2500 mg daily   Ensure Take 237 mLs  by mouth. One daily   finasteride  5 MG tablet Commonly known as: PROSCAR  Take 1 tablet (5 mg total) by mouth daily.   Fish Oil 1200 MG Caps Take 1,200 mg by mouth daily.   ipratropium 0.03 % nasal spray Commonly known as: ATROVENT Place 2 sprays into both nostrils 3 (three) times daily.   multivitamins ther. w/minerals Tabs tablet Take 1 tablet by mouth daily.   NIFEdipine  30 MG 24 hr tablet Commonly known as: PROCARDIA -XL/NIFEDICAL-XL Take 30 mg by mouth 2 (two) times daily.   pantoprazole  40 MG tablet Commonly known as: PROTONIX  Take 1 tablet (40 mg total) by mouth 2 (two) times daily.   zinc gluconate 50 MG tablet Take 50 mg by mouth daily.   zolpidem  10 MG tablet Commonly known as: AMBIEN  Take 10 mg by mouth at bedtime as needed.        Allergies: No Known Allergies  Family History: Family History  Problem Relation Age of Onset   Cancer Brother     Social History:  reports that he has never smoked. He has been exposed to tobacco smoke. He has never used smokeless tobacco. He reports that he does not drink alcohol and does not use drugs.  ROS: All other review of systems were reviewed and are negative except what is noted above in HPI  Physical  Exam: BP (!) 141/62   Pulse (!) 58   Constitutional:  Alert and oriented, No acute distress. HEENT: [place] AT, moist mucus membranes.  Trachea midline, no masses. Cardiovascular: No clubbing, cyanosis, or edema. Respiratory: Normal respiratory effort, no increased work of breathing. GI: Abdomen is soft, nontender, nondistended, no abdominal masses GU: No CVA tenderness.  Lymph: No cervical or inguinal lymphadenopathy. Skin: No rashes, bruises or suspicious lesions. Neurologic: Grossly intact, no focal deficits, moving all 4 extremities. Psychiatric: Normal mood and affect.  Laboratory Data: Lab Results  Component Value Date   WBC 6.1 11/19/2014   HGB 13.2 11/19/2014   HCT 37.7 (L) 11/19/2014   MCV 88.1  11/19/2014   PLT 195 11/19/2014    Lab Results  Component Value Date   CREATININE 0.74 11/18/2014    Lab Results  Component Value Date   PSA 23.6 (H) 11/16/2019    No results found for: TESTOSTERONE  No results found for: HGBA1C  Urinalysis    Component Value Date/Time   COLORURINE YELLOW 11/17/2014 1018   APPEARANCEUR Clear 02/06/2024 1325   LABSPEC 1.017 11/17/2014 1018   PHURINE 5.5 11/17/2014 1018   GLUCOSEU Negative 02/06/2024 1325   HGBUR NEGATIVE 11/17/2014 1018   BILIRUBINUR Negative 02/06/2024 1325   KETONESUR NEGATIVE 11/17/2014 1018   PROTEINUR Negative 02/06/2024 1325   PROTEINUR NEGATIVE 11/17/2014 1018   UROBILINOGEN 0.2 07/12/2022 1110   UROBILINOGEN 0.2 11/17/2014 1018   NITRITE Negative 02/06/2024 1325   NITRITE NEGATIVE 11/17/2014 1018   LEUKOCYTESUR Negative 02/06/2024 1325    Lab Results  Component Value Date   LABMICR Comment 02/06/2024   WBCUA 0-5 07/11/2023   LABEPIT 0-10 07/11/2023   BACTERIA None seen 07/11/2023    Pertinent Imaging:  No results found for this or any previous visit.  No results found for this or any previous visit.  No results found for this or any previous visit.  No results found for this or any previous visit.  No results found for this or any previous visit.  No results found for this or any previous visit.  No results found for this or any previous visit.  No results found for this or any previous visit.   Assessment & Plan:    1. Prostate cancer (HCC) (Primary) -followup 6 months with a PSA - Urinalysis, Routine w reflex microscopic  2.Nocturia -finasteride  5mg  daily - Urinalysis, Routine w reflex microscopic   No follow-ups on file.  Belvie Clara, MD  [facility]

## 2024-08-12 NOTE — Patient Instructions (Signed)

## 2024-08-14 ENCOUNTER — Other Ambulatory Visit (INDEPENDENT_AMBULATORY_CARE_PROVIDER_SITE_OTHER): Payer: Self-pay | Admitting: Gastroenterology

## 2025-02-02 ENCOUNTER — Other Ambulatory Visit

## 2025-02-10 ENCOUNTER — Ambulatory Visit: Admitting: Urology
